# Patient Record
Sex: Female | Born: 1985 | Race: Black or African American | Hispanic: No | Marital: Single | Smoking: Former smoker
Health system: Southern US, Community
[De-identification: ages and names within clinical notes are randomized; demographics above are authoritative.]

## PROBLEM LIST (undated history)

## (undated) DIAGNOSIS — F99 Mental disorder, not otherwise specified: Secondary | ICD-10-CM

## (undated) DIAGNOSIS — B009 Herpesviral infection, unspecified: Secondary | ICD-10-CM

## (undated) HISTORY — DX: Herpesviral infection, unspecified: B00.9

## (undated) HISTORY — DX: Mental disorder, not otherwise specified: F99

---

## 2015-06-11 ENCOUNTER — Emergency Department (HOSPITAL_COMMUNITY)
Admission: EM | Admit: 2015-06-11 | Discharge: 2015-06-11 | Disposition: A | Discharge status: Home / Self Care | Payer: PRIVATE HEALTH INSURANCE | Attending: Emergency Medicine | Admitting: Emergency Medicine

## 2015-06-11 ENCOUNTER — Encounter (HOSPITAL_COMMUNITY): Payer: Self-pay | Admitting: Neurology

## 2015-06-11 DIAGNOSIS — R110 Nausea: Secondary | ICD-10-CM | POA: Insufficient documentation

## 2015-06-11 DIAGNOSIS — M791 Myalgia: Secondary | ICD-10-CM | POA: Diagnosis not present

## 2015-06-11 DIAGNOSIS — Z3202 Encounter for pregnancy test, result negative: Secondary | ICD-10-CM | POA: Insufficient documentation

## 2015-06-11 DIAGNOSIS — R51 Headache: Secondary | ICD-10-CM | POA: Diagnosis not present

## 2015-06-11 DIAGNOSIS — L989 Disorder of the skin and subcutaneous tissue, unspecified: Secondary | ICD-10-CM | POA: Diagnosis not present

## 2015-06-11 DIAGNOSIS — R69 Illness, unspecified: Secondary | ICD-10-CM

## 2015-06-11 DIAGNOSIS — J029 Acute pharyngitis, unspecified: Secondary | ICD-10-CM | POA: Diagnosis present

## 2015-06-11 DIAGNOSIS — J111 Influenza due to unidentified influenza virus with other respiratory manifestations: Secondary | ICD-10-CM | POA: Diagnosis not present

## 2015-06-11 DIAGNOSIS — R5383 Other fatigue: Secondary | ICD-10-CM | POA: Diagnosis not present

## 2015-06-11 DIAGNOSIS — R630 Anorexia: Secondary | ICD-10-CM | POA: Diagnosis not present

## 2015-06-11 LAB — COMPREHENSIVE METABOLIC PANEL
ALK PHOS: 69 U/L (ref 38–126)
ALT: 13 U/L — ABNORMAL LOW (ref 14–54)
AST: 21 U/L (ref 15–41)
Albumin: 3.9 g/dL (ref 3.5–5.0)
Anion gap: 10 (ref 5–15)
BUN: 8 mg/dL (ref 6–20)
CO2: 22 mmol/L (ref 22–32)
Calcium: 9.3 mg/dL (ref 8.9–10.3)
Chloride: 107 mmol/L (ref 101–111)
Creatinine, Ser: 0.84 mg/dL (ref 0.44–1.00)
GLUCOSE: 97 mg/dL (ref 65–99)
Potassium: 3.6 mmol/L (ref 3.5–5.1)
SODIUM: 139 mmol/L (ref 135–145)
Total Bilirubin: 0.5 mg/dL (ref 0.3–1.2)
Total Protein: 7.1 g/dL (ref 6.5–8.1)

## 2015-06-11 LAB — CBC WITH DIFFERENTIAL/PLATELET
Basophils Absolute: 0.1 10*3/uL (ref 0.0–0.1)
Basophils Relative: 1 % (ref 0–1)
EOS PCT: 1 % (ref 0–5)
Eosinophils Absolute: 0.1 10*3/uL (ref 0.0–0.7)
HCT: 42.5 % (ref 36.0–46.0)
HEMOGLOBIN: 14.3 g/dL (ref 12.0–15.0)
LYMPHS ABS: 2.2 10*3/uL (ref 0.7–4.0)
Lymphocytes Relative: 38 % (ref 12–46)
MCH: 28.4 pg (ref 26.0–34.0)
MCHC: 33.6 g/dL (ref 30.0–36.0)
MCV: 84.5 fL (ref 78.0–100.0)
MONO ABS: 0.8 10*3/uL (ref 0.1–1.0)
Monocytes Relative: 13 % — ABNORMAL HIGH (ref 3–12)
NEUTROS ABS: 2.8 10*3/uL (ref 1.7–7.7)
Neutrophils Relative %: 47 % (ref 43–77)
Platelets: 262 10*3/uL (ref 150–400)
RBC: 5.03 MIL/uL (ref 3.87–5.11)
RDW: 13 % (ref 11.5–15.5)
WBC: 5.8 10*3/uL (ref 4.0–10.5)

## 2015-06-11 LAB — URINALYSIS, ROUTINE W REFLEX MICROSCOPIC
BILIRUBIN URINE: NEGATIVE
Glucose, UA: NEGATIVE mg/dL
Hgb urine dipstick: NEGATIVE
KETONES UR: NEGATIVE mg/dL
Leukocytes, UA: NEGATIVE
NITRITE: NEGATIVE
PH: 5 (ref 5.0–8.0)
PROTEIN: NEGATIVE mg/dL
Specific Gravity, Urine: 1.025 (ref 1.005–1.030)
UROBILINOGEN UA: 0.2 mg/dL (ref 0.0–1.0)

## 2015-06-11 LAB — RAPID STREP SCREEN (MED CTR MEBANE ONLY): STREPTOCOCCUS, GROUP A SCREEN (DIRECT): NEGATIVE

## 2015-06-11 LAB — POC URINE PREG, ED: Preg Test, Ur: NEGATIVE

## 2015-06-11 MED ORDER — DOXYCYCLINE HYCLATE 100 MG PO TABS: 100.0000 mg | ORAL_TABLET | Freq: Two times a day (BID) | ORAL | Status: DC

## 2015-06-11 MED ORDER — DOXYCYCLINE HYCLATE 100 MG PO TABS
100.0000 mg | ORAL_TABLET | Freq: Once | ORAL | Status: AC
  Administered 2015-06-11: 100 mg via ORAL
  Filled 2015-06-11: qty 1

## 2015-06-11 MED ORDER — ONDANSETRON HCL 4 MG/2ML IJ SOLN
4.0000 mg | Freq: Once | INTRAMUSCULAR | Status: AC
  Administered 2015-06-11: 4 mg via INTRAVENOUS
  Filled 2015-06-11: qty 2

## 2015-06-11 MED ORDER — KETOROLAC TROMETHAMINE 30 MG/ML IJ SOLN
30.0000 mg | Freq: Once | INTRAMUSCULAR | Status: AC
  Administered 2015-06-11: 30 mg via INTRAVENOUS
  Filled 2015-06-11: qty 1

## 2015-06-11 MED ORDER — SODIUM CHLORIDE 0.9 % IV BOLUS (SEPSIS)
1000.0000 mL | Freq: Once | INTRAVENOUS | Status: AC
  Administered 2015-06-11: 1000 mL via INTRAVENOUS

## 2015-06-11 NOTE — ED Provider Notes (Signed)
CSN: [id number]     Arrival date & time 06/11/15  0727 History   First MD Initiated Contact with Patient 06/11/15 [phone]     Chief Complaint  Patient presents with  . Animal Bite     HPI  Patient presents with multiple complaints. She was generally well until 5 days ago. About that time she developed generalized fatigue with myalgia, headache, sore throat. Symptoms of been persistent, worsening, with associated anorexia, nausea. Headache is diffuse. No fever. No clear alleviating or exacerbating factors, and no relief with OTC medication. No confusion, disorientation. Patient denies visual loss, does describe difficulty focusing visually. Patient recalls possible insect exposure a day prior to the event, with discovery of a very small black, painful lesion on her right medial thigh. Subsequent, the lesion has diminished in size, is not painful, symptomatic. Patient does not smoke, takes no medication beyond Depo-Provera. Last menstrual cycle 2 weeks ago.   History reviewed. No pertinent past medical history. History reviewed. No pertinent past surgical history. No family history on file. History  Substance Use Topics  . Smoking status: Never Smoker   . Smokeless tobacco: Not on file  . Alcohol Use: Yes   OB History    No data available     Review of Systems  Constitutional:       Per HPI, otherwise negative  HENT:       Per HPI, otherwise negative  Respiratory:       Per HPI, otherwise negative  Cardiovascular:       Per HPI, otherwise negative  Gastrointestinal: Negative for vomiting.  Endocrine:       Negative aside from HPI  Genitourinary:       Neg aside from HPI   Musculoskeletal:       Per HPI, otherwise negative  Skin: Negative.   Neurological: Negative for syncope.      Allergies  Review of patient's allergies indicates no known allergies.  Home Medications   Prior to Admission medications   Medication Sig Start Date End Date Taking? Authorizing  Provider  aspirin-acetaminophen-caffeine (EXCEDRIN MIGRAINE) [phone] MG per tablet Take 2 tablets by mouth daily as needed for headache.   Yes Historical Provider, MD  MedroxyPROGESTERone Acetate (DEPO-PROVERA IM) Inject 1 Dose into the muscle every 3 (three) months.   Yes Historical Provider, MD   BP 157/83 mmHg  Pulse 84  Temp(Src) 98.4 F (36.9 C) (Oral)  Resp 16  Ht  (1.651 m)  Wt 170 lb (77.111 kg)  BMI 28.29 kg/m2  SpO2 99%  LMP 05/28/2015 Physical Exam  Constitutional: She is oriented to person, place, and time. She appears well-developed and well-nourished. No distress.  HENT:  Head: Normocephalic and atraumatic.  Eyes: Conjunctivae and EOM are normal.  Cardiovascular: Normal rate and regular rhythm.   Pulmonary/Chest: Effort normal and breath sounds normal. No stridor. No respiratory distress. She has no wheezes.  Abdominal: She exhibits no distension.  Musculoskeletal: She exhibits no edema or tenderness.  Neurological: She is alert and oriented to person, place, and time. No cranial nerve deficit. She exhibits normal muscle tone. Coordination normal.  Skin: Skin is warm and dry.  Right medial thigh with 1 mm black minimally visible nonraised lesion  Psychiatric: She has a normal mood and affect. Her behavior is normal. Thought content normal.  Nursing note and vitals reviewed.   ED Course  Procedures (including critical care time) Labs Review Labs Reviewed  COMPREHENSIVE METABOLIC PANEL - Abnormal; Notable for the following:  ALT 13 (*)    All other components within normal limits  CBC WITH DIFFERENTIAL/PLATELET - Abnormal; Notable for the following:    Monocytes Relative 13 (*)    All other components within normal limits  RAPID STREP SCREEN (NOT AT [facility])  CULTURE, GROUP A STREP  URINALYSIS, ROUTINE W REFLEX MICROSCOPIC (NOT AT [facility])  B. BURGDORFI ANTIBODIES  ROCKY MTN SPOTTED FVR ABS PNL(IGG+IGM)  POC URINE PREG, ED   Pulse oximetry 100% room air  normal  I reviewed the results (including imaging as performed), agree with the interpretation  On repeat exam the patient appears better.  We reviewed all findings.  MDM   Final diagnoses:  Influenza-like illness   Patient presents with influenza-like symptoms for several days, after possible ventral-based exposure. Here patient is awake and alert, but appears fatigued. Patient's initial evaluation is largely reassuring, no evidence for sepsis, bacteremia. Patient has pending studies for Lyme disease, recommend spotted fever, started empiric therapy. Patient was referred to our affiliated primary care center.  Gerhard Munchobert Nakaila Freeze, MD 06/11/15 [phone]

## 2015-06-11 NOTE — Discharge Instructions (Signed)
Today's evaluation has been largely reassuring.  Please take all medication as directed, stay well-hydrated, Plenty of rest.  Recall that there are several laboratory studies that are still being conducted.  You will be made aware of abnormal results.  Otherwise, his be sure to follow-up with our primary care center.

## 2015-06-11 NOTE — ED Notes (Signed)
Pt reports was walking in the woods on Friday, yesterday saw tick on the wall in her room, then noticed itchy pinpoint area to right inner thigh. There was no tick ever noted on skin. Has been feeling nauseated,achy,h/a.

## 2015-06-12 LAB — ROCKY MTN SPOTTED FVR ABS PNL(IGG+IGM)
RMSF IGG: NEGATIVE
RMSF IgM: 0.25 index (ref 0.00–0.89)

## 2015-06-12 LAB — B. BURGDORFI ANTIBODIES

## 2015-06-13 LAB — CULTURE, GROUP A STREP: STREP A CULTURE: NEGATIVE

## 2020-03-27 ENCOUNTER — Encounter (HOSPITAL_BASED_OUTPATIENT_CLINIC_OR_DEPARTMENT_OTHER): Payer: Self-pay | Admitting: *Deleted

## 2020-03-27 ENCOUNTER — Other Ambulatory Visit: Payer: Self-pay

## 2020-03-27 ENCOUNTER — Emergency Department (HOSPITAL_BASED_OUTPATIENT_CLINIC_OR_DEPARTMENT_OTHER)
Admission: EM | Admit: 2020-03-27 | Discharge: 2020-03-27 | Disposition: A | Discharge status: Home / Self Care | Payer: BC Managed Care – PPO | Attending: Emergency Medicine | Admitting: Emergency Medicine

## 2020-03-27 DIAGNOSIS — Y9389 Activity, other specified: Secondary | ICD-10-CM | POA: Insufficient documentation

## 2020-03-27 DIAGNOSIS — S46811A Strain of other muscles, fascia and tendons at shoulder and upper arm level, right arm, initial encounter: Secondary | ICD-10-CM

## 2020-03-27 DIAGNOSIS — S4991XA Unspecified injury of right shoulder and upper arm, initial encounter: Secondary | ICD-10-CM | POA: Diagnosis present

## 2020-03-27 DIAGNOSIS — Y999 Unspecified external cause status: Secondary | ICD-10-CM | POA: Insufficient documentation

## 2020-03-27 DIAGNOSIS — F17210 Nicotine dependence, cigarettes, uncomplicated: Secondary | ICD-10-CM | POA: Diagnosis not present

## 2020-03-27 DIAGNOSIS — Y9289 Other specified places as the place of occurrence of the external cause: Secondary | ICD-10-CM | POA: Insufficient documentation

## 2020-03-27 DIAGNOSIS — X503XXA Overexertion from repetitive movements, initial encounter: Secondary | ICD-10-CM | POA: Insufficient documentation

## 2020-03-27 MED ORDER — METHOCARBAMOL 500 MG PO TABS: 500.0000 mg | ORAL_TABLET | Freq: Two times a day (BID) | ORAL | 0 refills | Status: DC

## 2020-03-27 NOTE — Discharge Instructions (Signed)
Please take Tylenol, ibuprofen, Robaxin for pain.  Please use Tylenol or ibuprofen for pain.  You may use 600 mg ibuprofen every 6 hours or 1000 mg of Tylenol every 6 hours.  You may choose to alternate between the 2.  This would be most effective.  Not to exceed 4 g of Tylenol within 24 hours.  Not to exceed 3200 mg ibuprofen 24 hours.

## 2020-03-27 NOTE — ED Provider Notes (Signed)
[facility] EMERGENCY DEPARTMENT Provider Note   CSN: [id number] Arrival date & time: 03/27/20  1016     History Chief Complaint  Patient presents with  . Neck, shoulder pain    Shelley Love is a 34 y.o. female.  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  She states that she works long hours on cement floors and states that she has had ongoing intermittent neck and shoulder pain for the past several years.  She states over the past month that is flared up again and over the past week it has gotten worse.  She states that she does daily looking down and turning over a boxes.  She states that pain seems to be a on the right side of her neck today and radiate into her right shoulder.  She states it is worse when she looks right and occasionally when she turns her head she feels a "twinge ".  She denies any vision changes, headache, dizziness, loss of consciousness, trauma, chest pain, shortness of breath, weakness or numbness in arms or legs.      History reviewed. No pertinent past medical history.  There are no problems to display for this patient.   History reviewed. No pertinent surgical history.   OB History   No obstetric history on file.     History reviewed. No pertinent family history.  Social History   Tobacco Use  . Smoking status: Current Every Day Smoker    Types: Cigarettes  . Smokeless tobacco: Never Used  . Tobacco comment: occasionally  Substance Use Topics  . Alcohol use: Yes    Comment: occasionally  . Drug use: Yes    Types: Marijuana    Comment: occasionally    Home Medications Prior to Admission medications   Medication Sig Start Date End Date Taking? Authorizing Provider  aspirin-acetaminophen-caffeine (EXCEDRIN MIGRAINE) [phone] MG per tablet Take 2 tablets by mouth daily as needed for headache.   Yes [provider]  methocarbamol (ROBAXIN) 500 MG tablet Take 1 tablet (500 mg total) by mouth 2 (two) times daily.  03/27/20   Tedd Sias, PA    Allergies    Patient has no known allergies.  Review of Systems   Review of Systems  Constitutional: Negative for fever.  HENT: Negative for congestion.   Respiratory: Negative for shortness of breath.   Cardiovascular: Negative for chest pain.  Gastrointestinal: Negative for abdominal distention.  Musculoskeletal:       Pain in Right-sided neck   Neurological: Negative for dizziness and headaches.    Physical Exam Updated Vital Signs BP 126/72 (BP Location: Right Arm)   Pulse 67   Temp 99 F (37.2 C) (Oral)   Resp 16   Ht 5\' 5"  (1.651 m)   Wt 75.8 kg   LMP 03/06/2020   SpO2 100%   BMI 27.79 kg/m   Physical Exam Vitals and nursing note reviewed.  Constitutional:      General: She is not in acute distress.    Appearance: Normal appearance. She is not ill-appearing.  HENT:     Head: Normocephalic and atraumatic.     Mouth/Throat:     Mouth: Mucous membranes are moist.  Eyes:     General: No scleral icterus.       Right eye: No discharge.        Left eye: No discharge.     Conjunctiva/sclera: Conjunctivae normal.  Neck:     Comments: See [facility] Pulmonary:  Effort: Pulmonary effort is normal.     Breath sounds: No stridor.  Musculoskeletal:     Cervical back: Neck supple. Tenderness present. No rigidity.     Comments: Reproducible tenderness to palpation of the right paracervical musculature.  There is also tenderness to palpation of the right trapezius/deltoid.  This is reproducible on exam.  Full range of motion of neck and shoulder.  5/5 strength in bilateral upper and lower extremities.  Neurological:     Mental Status: She is alert and oriented to person, place, and time. Mental status is at baseline.     Comments: Sensation intact bilateral upper and lower extremities.  Gait normal.     ED Results / Procedures / Treatments   Labs (all labs ordered are listed, but only abnormal results are displayed) Labs Reviewed - No  data to display  EKG None  Radiology No results found.  Procedures Procedures (including critical care time)  Medications Ordered in ED Medications - No data to display  ED Course  I have reviewed the triage vital signs and the nursing notes.  Pertinent labs & imaging results that were available during my care of the patient were reviewed by me and considered in my medical decision making (see chart for details).    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  This is been intermittent for some time.  Seems to be worsened at work.  There is no indication that this patient would have emergent medical conditions such as cervical fracture, vertebral artery or carotid artery dissection.  She has no midline tenderness.  No neurologic findings.  Diagnosis trapezius strain, gave strict return precautions.  Recommended conservative measures Robaxin, Tylenol, ibuprofen, stretching, warm salt water soaks and massage.  We discussed the need for behavior modification at work in order to prevent further injury.  Final Clinical Impression(s) / ED Diagnoses Final diagnoses:  Strain of right trapezius muscle, initial encounter    Rx / DC Orders ED Discharge Orders         Ordered    methocarbamol (ROBAXIN) 500 MG tablet  2 times daily,   Status:  Discontinued     03/27/20 1121    methocarbamol (ROBAXIN) 500 MG tablet  2 times daily     03/27/20 1129           Solon Augusta [place], [place] 03/27/20 1132    Geoffery Lyons, MD 03/27/20 1324

## 2020-03-27 NOTE — ED Triage Notes (Signed)
Neck pain stiffness started on the 5th of this month and bilateral shoulder pain

## 2020-03-27 NOTE — ED Notes (Signed)
Pt verbalized understanding of dc instructions.

## 2020-07-18 ENCOUNTER — Other Ambulatory Visit: Payer: Self-pay

## 2020-07-18 ENCOUNTER — Emergency Department (HOSPITAL_COMMUNITY)
Admission: EM | Admit: 2020-07-18 | Discharge: 2020-07-18 | Disposition: A | Discharge status: Home / Self Care | Payer: BC Managed Care – PPO | Attending: Emergency Medicine | Admitting: Emergency Medicine

## 2020-07-18 ENCOUNTER — Encounter (HOSPITAL_COMMUNITY): Payer: Self-pay

## 2020-07-18 DIAGNOSIS — Z20822 Contact with and (suspected) exposure to covid-19: Secondary | ICD-10-CM | POA: Insufficient documentation

## 2020-07-18 DIAGNOSIS — F17210 Nicotine dependence, cigarettes, uncomplicated: Secondary | ICD-10-CM | POA: Insufficient documentation

## 2020-07-18 DIAGNOSIS — R45851 Suicidal ideations: Secondary | ICD-10-CM

## 2020-07-18 DIAGNOSIS — R519 Headache, unspecified: Secondary | ICD-10-CM | POA: Insufficient documentation

## 2020-07-18 DIAGNOSIS — F329 Major depressive disorder, single episode, unspecified: Secondary | ICD-10-CM | POA: Insufficient documentation

## 2020-07-18 DIAGNOSIS — F32A Depression, unspecified: Secondary | ICD-10-CM

## 2020-07-18 DIAGNOSIS — F419 Anxiety disorder, unspecified: Secondary | ICD-10-CM

## 2020-07-18 LAB — COMPREHENSIVE METABOLIC PANEL
ALT: 12 U/L (ref 0–44)
AST: 18 U/L (ref 15–41)
Albumin: 4.5 g/dL (ref 3.5–5.0)
Alkaline Phosphatase: 52 U/L (ref 38–126)
Anion gap: 8 (ref 5–15)
BUN: 9 mg/dL (ref 6–20)
CO2: 22 mmol/L (ref 22–32)
Calcium: 9.3 mg/dL (ref 8.9–10.3)
Chloride: 107 mmol/L (ref 98–111)
Creatinine, Ser: 0.83 mg/dL (ref 0.44–1.00)
GFR calc Af Amer: >60 mL/min (ref >60)
GFR calc non Af Amer: >60 mL/min (ref >60)
Glucose, Bld: 109 mg/dL — ABNORMAL HIGH (ref 70–99)
Potassium: 3.8 mmol/L (ref 3.5–5.1)
Sodium: 137 mmol/L (ref 135–145)
Total Bilirubin: 0.7 mg/dL (ref 0.3–1.2)
Total Protein: 7.4 g/dL (ref 6.5–8.1)

## 2020-07-18 LAB — ETHANOL: Alcohol, Ethyl (B): <10 mg/dL (ref <10)

## 2020-07-18 LAB — ACETAMINOPHEN LEVEL: Acetaminophen (Tylenol), Serum: 13 ug/mL (ref 10–30)

## 2020-07-18 LAB — RAPID URINE DRUG SCREEN, HOSP PERFORMED
Amphetamines: NOT DETECTED
Barbiturates: NOT DETECTED
Benzodiazepines: NOT DETECTED
Cocaine: NOT DETECTED
Opiates: NOT DETECTED
Tetrahydrocannabinol: NOT DETECTED

## 2020-07-18 LAB — CBC
HCT: 42.6 % (ref 36.0–46.0)
Hemoglobin: 13.8 g/dL (ref 12.0–15.0)
MCH: 28.4 pg (ref 26.0–34.0)
MCHC: 32.4 g/dL (ref 30.0–36.0)
MCV: 87.7 fL (ref 80.0–100.0)
Platelets: 304 10*3/uL (ref 150–400)
RBC: 4.86 MIL/uL (ref 3.87–5.11)
RDW: 12.7 % (ref 11.5–15.5)
WBC: 6.5 10*3/uL (ref 4.0–10.5)
nRBC: 0 % (ref 0.0–0.2)

## 2020-07-18 LAB — SALICYLATE LEVEL: Salicylate Lvl: <7 mg/dL — ABNORMAL LOW (ref 7.0–30.0)

## 2020-07-18 LAB — SARS CORONAVIRUS 2 BY RT PCR (HOSPITAL ORDER, PERFORMED IN ~~LOC~~ HOSPITAL LAB): SARS Coronavirus 2: NEGATIVE

## 2020-07-18 LAB — I-STAT BETA HCG BLOOD, ED (MC, WL, AP ONLY): I-stat hCG, quantitative: <5 m[IU]/mL (ref <5)

## 2020-07-18 MED ORDER — ACETAMINOPHEN 500 MG PO TABS
1000.0000 mg | ORAL_TABLET | Freq: Once | ORAL | Status: AC
  Administered 2020-07-18: 1000 mg via ORAL
  Filled 2020-07-18: qty 2

## 2020-07-18 MED ORDER — ONDANSETRON 4 MG PO TBDP
4.0000 mg | ORAL_TABLET | Freq: Once | ORAL | Status: AC
  Administered 2020-07-18: 4 mg via ORAL
  Filled 2020-07-18: qty 1

## 2020-07-18 NOTE — BH Assessment (Signed)
Clinician called the Tele-Assessment machine several times in an attempt to complete pt's BH Assessment but there was no answer. TTS will attempt at a later time.

## 2020-07-18 NOTE — ED Provider Notes (Signed)
[facility]-EMERGENCY DEPT Provider Note   CSN: [id number] Arrival date & time: 07/18/20  2671     History Chief Complaint  Patient presents with  . Suicidal    Shelley Love is a 34 y.o. female.  Patient to ED with progressive depression with suicidal ideation today. She denies self harm. She reports headache "probably because I've been crying all day." No ss/sxs illness. She reports remote history of suicidal thoughts at the age of 62.   The history is provided by the patient. No language interpreter was used.       History reviewed. No pertinent past medical history.  There are no problems to display for this patient.   History reviewed. No pertinent surgical history.   OB History   No obstetric history on file.     No family history on file.  Social History   Tobacco Use  . Smoking status: Current Every Day Smoker    Types: Cigarettes  . Smokeless tobacco: Never Used  . Tobacco comment: occasionally  Vaping Use  . Vaping Use: Former  Substance Use Topics  . Alcohol use: Yes    Comment: occasionally  . Drug use: Yes    Types: Marijuana    Comment: occasionally    Home Medications Prior to Admission medications   Medication Sig Start Date End Date Taking? Authorizing Provider  aspirin-acetaminophen-caffeine (EXCEDRIN MIGRAINE) [phone] MG per tablet Take 2 tablets by mouth daily as needed for headache.    [provider]  methocarbamol (ROBAXIN) 500 MG tablet Take 1 tablet (500 mg total) by mouth 2 (two) times daily. 03/27/20   Gailen Shelter, PA    Allergies    Patient has no known allergies.  Review of Systems   Review of Systems  Constitutional: Negative for chills and fever.  HENT: Negative.   Respiratory: Negative.   Cardiovascular: Negative.   Gastrointestinal: Negative.   Musculoskeletal: Negative.   Skin: Negative.   Neurological: Positive for headaches.  Psychiatric/Behavioral: Positive for dysphoric  mood and suicidal ideas.    Physical Exam Updated Vital Signs BP 122/86 (BP Location: Left Arm)   Pulse 75   Temp 98.5 F (36.9 C) (Oral)   Resp 19   SpO2 99%   Physical Exam Vitals and nursing note reviewed.  Constitutional:      Appearance: She is well-developed.  HENT:     Head: Normocephalic.  Cardiovascular:     Rate and Rhythm: Normal rate and regular rhythm.  Pulmonary:     Effort: Pulmonary effort is normal.     Breath sounds: Normal breath sounds.  Abdominal:     General: Bowel sounds are normal.     Palpations: Abdomen is soft.     Tenderness: There is no abdominal tenderness. There is no guarding or rebound.  Musculoskeletal:        General: Normal range of motion.     Cervical back: Normal range of motion and neck supple.  Skin:    General: Skin is warm and dry.     Findings: No rash.  Neurological:     Mental Status: She is alert.     Cranial Nerves: No cranial nerve deficit.     ED Results / Procedures / Treatments   Labs (all labs ordered are listed, but only abnormal results are displayed) Labs Reviewed  CBC  COMPREHENSIVE METABOLIC PANEL  ETHANOL  SALICYLATE LEVEL  ACETAMINOPHEN LEVEL  RAPID URINE DRUG SCREEN, HOSP PERFORMED  I-STAT BETA HCG BLOOD,  ED ([facility], [facility], [facility] ONLY)    EKG None  Radiology No results found.  Procedures Procedures (including critical care time)  Medications Ordered in ED Medications - No data to display  ED Course  I have reviewed the triage vital signs and the nursing notes.  Pertinent labs & imaging results that were available during my care of the patient were reviewed by me and considered in my medical decision making (see chart for details).    MDM Rules/Calculators/A&P                          Patient voluntarily to ED with severe depression and suicidal ideations.   She is considered medically cleared for TTS evaluation which is pending.   Final Clinical Impression(s) / ED Diagnoses Final  diagnoses:  None   1. Depression 2. Suicidal ideations  Rx / DC Orders ED Discharge Orders    None       Danne Harbor 07/18/20 0710    Glynn Octave, MD 07/18/20 [phone]

## 2020-07-18 NOTE — ED Triage Notes (Signed)
Patient arrived stating that she has been feeling suicidal due to work and family stressors. States she would cut her wrists. Declines any harm to herself today.

## 2020-07-18 NOTE — Consult Note (Signed)
[facility] Psych ED Progress Note  07/18/2020 10:03 AM Shelley Love  MRN:  [id number] Subjective: Patient states "I had an episode yesterday where a lot of things were happening leading up to suicidal thoughts."  Patient reports readiness to discharge today, reports need to attend a job interview later today.  Patient denies suicidal ideations currently.  Patient states "I had some time to myself while I was here, some time to think, I do not want to hurt myself."  Patient endorses 1 prior suicide attempt in 2009.  Patient reports she was diagnosed with anxiety and depression in 2009, prescribed Zoloft at that time believes it helped very little."  Patient denies any current medications. 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  Patient reports current stressors include financial concerns, "family stuff" and my boyfriend.  Patient reports she resides in [place] with her boyfriend.  Patient denies access to weapons.  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  Patient endorses alcohol use approximately 2 drinks 2 times per week.  Patient endorses marijuana use approximately 1 joint 2 times per week.  Patient denies substance use aside from marijuana.  Patient does not believe she needs substance use treatment at this time.  Patient reports she does not currently have insurance but would like to follow-up with outpatient psychiatry and talk therapy.  Discussed follow-up with [facility], patient agrees.  Patient assessed by nurse practitioner.  Patient alert and oriented x4, pleasant and cooperative during assessment.  Patient appears anxious, affect is congruent with mood.  Patient tearful at times when describing current stressors.  Patient denies homicidal ideations.  Patient denies auditory and visual hallucinations.  There is no evidence of delusional  thought content and patient does not appear to be responding to internal stimuli.  Patient denies symptoms of paranoia.  Patient gives verbal consent to speak with her boyfriend of the home, Eugenia Pancoast, phone number [phone]. Spoke with patient's boyfriend, Marlowe Sax who denies concerns for safety.  Patient boyfriend denies any history of self-harm or suicidal ideations to his knowledge.  Patient's boyfriend reports patient can return home.  Principal Problem: Anxiety Diagnosis:  Principal Problem:   Anxiety  Total Time spent with patient: 30 minutes  Past Psychiatric History: Anxiety, depression per patient report  Past Medical History: History reviewed. No pertinent past medical history. History reviewed. No pertinent surgical history. Family History: No family history on file. Family Psychiatric  History: None reported Social History:  Social History   Substance and Sexual Activity  Alcohol Use Yes   Comment: occasionally     Social History   Substance and Sexual Activity  Drug Use Yes  . Types: Marijuana   Comment: occasionally    Social History   Socioeconomic History  . Marital status: Single    Spouse name: Not on file  . Number of children: Not on file  . Years of education: Not on file  . Highest education level: Not on file  Occupational History  . Not on file  Tobacco Use  . Smoking status: Current Every Day Smoker    Types: Cigarettes  . Smokeless tobacco: Never Used  . Tobacco comment: occasionally  Vaping Use  . Vaping Use: Former  Substance and Sexual Activity  . Alcohol use: Yes    Comment: occasionally  . Drug use: Yes    Types: Marijuana    Comment: occasionally  .  Sexual activity: Yes  Other Topics Concern  . Not on file  Social History Narrative  . Not on file   Social Determinants of Health   Financial Resource Strain:   . Difficulty of Paying Living Expenses:   Food Insecurity:   . Worried About Programme researcher, broadcasting/film/video in the Last  Year:   . Barista in the Last Year:   Transportation Needs:   . Freight forwarder (Medical):   Marland Kitchen Lack of Transportation (Non-Medical):   Physical Activity:   . Days of Exercise per Week:   . Minutes of Exercise per Session:   Stress:   . Feeling of Stress :   Social Connections:   . Frequency of Communication with Friends and Family:   . Frequency of Social Gatherings with Friends and Family:   . Attends Religious Services:   . Active Member of Clubs or Organizations:   . Attends Banker Meetings:   Marland Kitchen Marital Status:     Sleep: Fair  Appetite:  Fair  Current Medications: No current facility-administered medications for this encounter.   Current Outpatient Medications  Medication Sig Dispense Refill  . aspirin-acetaminophen-caffeine (EXCEDRIN MIGRAINE) 250-250-65 MG per tablet Take 2 tablets by mouth daily as needed for headache.    . ibuprofen (ADVIL) 200 MG tablet Take 400 mg by mouth every 6 (six) hours as needed for moderate pain.    . methocarbamol (ROBAXIN) 500 MG tablet Take 1 tablet (500 mg total) by mouth 2 (two) times daily. (Patient not taking: Reported on 07/18/2020) 20 tablet 0    Lab Results:  Results for orders placed or performed during the hospital encounter of 07/18/20 (from the past 48 hour(s))  Comprehensive metabolic panel     Status: Abnormal   Collection Time: 07/18/20  2:33 AM  Result Value Ref Range   Sodium 137 135 - 145 mmol/L   Potassium 3.8 3.5 - 5.1 mmol/L   Chloride 107 98 - 111 mmol/L   CO2 22 22 - 32 mmol/L   Glucose, Bld 109 (H) 70 - 99 mg/dL    Comment: Glucose reference range applies only to samples taken after fasting for at least 8 hours.   BUN 9 6 - 20 mg/dL   Creatinine, Ser 5.36 0.44 - 1.00 mg/dL   Calcium 9.3 8.9 - 64.4 mg/dL   Total Protein 7.4 6.5 - 8.1 g/dL   Albumin 4.5 3.5 - 5.0 g/dL   AST 18 15 - 41 U/L   ALT 12 0 - 44 U/L   Alkaline Phosphatase 52 38 - 126 U/L   Total Bilirubin 0.7 0.3 - 1.2  mg/dL   GFR calc non Af Amer >60 >60 mL/min   GFR calc Af Amer >60 >60 mL/min   Anion gap 8 5 - 15    Comment: Performed at [facility], [address], [place], [place] [address]  Ethanol     Status: None   Collection Time: 07/18/20  2:33 AM  Result Value Ref Range   Alcohol, Ethyl (B) <10 <10 mg/dL    Comment: (NOTE) Lowest detectable limit for serum alcohol is 10 mg/dL.  For medical purposes only. Performed at [facility], [address] [place], [place] [address]   Salicylate level     Status: Abnormal   Collection Time: 07/18/20  2:33 AM  Result Value Ref Range   Salicylate Lvl <7.0 (L) 7.0 - 30.0 mg/dL    Comment: Performed at  [facility], [address] [place], [place] [address]  Acetaminophen level     Status: None   Collection Time: 07/18/20  2:33 AM  Result Value Ref Range   Acetaminophen (Tylenol), Serum 13 10 - 30 ug/mL    Comment: (NOTE) Therapeutic concentrations vary significantly. A range of 10-30 ug/mL  may be an effective concentration for many patients. However, some  are best treated at concentrations outside of this range. Acetaminophen concentrations >150 ug/mL at 4 hours after ingestion  and >50 ug/mL at 12 hours after ingestion are often associated with  toxic reactions.  Performed at [facility], [address] [place], [place] [address]   cbc     Status: None   Collection Time: 07/18/20  2:33 AM  Result Value Ref Range   WBC 6.5 4.0 - 10.5 K/uL   RBC 4.86 3.87 - 5.11 MIL/uL   Hemoglobin 13.8 12.0 - 15.0 g/dL   HCT 09.8 36 - 46 %   MCV 87.7 80.0 - 100.0 fL   MCH 28.4 26.0 - 34.0 pg   MCHC 32.4 30.0 - 36.0 g/dL   RDW 11.9 14.7 - 82.9 %   Platelets 304 150 - 400 K/uL   nRBC 0.0 0.0 - 0.2 %    Comment: Performed at [facility], [address] [place], [place] [address]  I-Stat beta hCG blood, ED     Status: None   Collection Time: 07/18/20   2:45 AM  Result Value Ref Range   I-stat hCG, quantitative <5.0 <5 mIU/mL   Comment 3            Comment:   GEST. AGE      CONC.  (mIU/mL)   <=1 WEEK        5 - 50     2 WEEKS       50 - 500     3 WEEKS       100 - 10,000     4 WEEKS     1,000 - 30,000        FEMALE AND NON-PREGNANT FEMALE:     LESS THAN 5 mIU/mL   SARS Coronavirus 2 by RT PCR (hospital order, performed in [facility] hospital lab) Nasopharyngeal Nasopharyngeal Swab     Status: None   Collection Time: 07/18/20  3:28 AM   Specimen: Nasopharyngeal Swab  Result Value Ref Range   SARS Coronavirus 2 NEGATIVE NEGATIVE    Comment: (NOTE) SARS-CoV-2 target nucleic acids are NOT DETECTED.  The SARS-CoV-2 RNA is generally detectable in upper and lower respiratory specimens during the acute phase of infection. The lowest concentration of SARS-CoV-2 viral copies this assay can detect is 250 copies / mL. A negative result does not preclude SARS-CoV-2 infection and should not be used as the sole basis for treatment or other patient management decisions.  A negative result may occur with improper specimen collection / handling, submission of specimen other than nasopharyngeal swab, presence of viral mutation(s) within the areas targeted by this assay, and inadequate number of viral copies (<250 copies / mL). A negative result must be combined with clinical observations, patient history, and epidemiological information.  Fact Sheet for Patients:   BoilerBrush.com.cy  Fact Sheet for Healthcare Providers: https://pope.com/  This test is not yet approved or  cleared by the [place] FDA and has been authorized for detection and/or diagnosis of SARS-CoV-2 by FDA under an Emergency Use Authorization (EUA).  This EUA  will remain in effect (meaning this test can be used) for the duration of the COVID-19 declaration under Section 564(b)(1) of the Act, 21 U.S.C. section  360bbb-3(b)(1), unless the authorization is terminated or revoked sooner.  Performed at [facility], [address] [place], [place] [address]   Rapid urine drug screen (hospital performed)     Status: None   Collection Time: 07/18/20  3:30 AM  Result Value Ref Range   Opiates NONE DETECTED NONE DETECTED   Cocaine NONE DETECTED NONE DETECTED   Benzodiazepines NONE DETECTED NONE DETECTED   Amphetamines NONE DETECTED NONE DETECTED   Tetrahydrocannabinol NONE DETECTED NONE DETECTED   Barbiturates NONE DETECTED NONE DETECTED    Comment: (NOTE) DRUG SCREEN FOR MEDICAL PURPOSES ONLY.  IF CONFIRMATION IS NEEDED FOR ANY PURPOSE, NOTIFY LAB WITHIN 5 DAYS.  LOWEST DETECTABLE LIMITS FOR URINE DRUG SCREEN Drug Class                     Cutoff (ng/mL) Amphetamine and metabolites    1000 Barbiturate and metabolites    200 Benzodiazepine                 200 Tricyclics and metabolites     300 Opiates and metabolites        300 Cocaine and metabolites        300 THC                            50 Performed at [facility], [address] [place], [place] [address]     Blood Alcohol level:  Lab Results  Component Value Date   ETH <10 07/18/2020    Physical Findings: AIMS:  , ,  ,  ,    CIWA:    COWS:     Musculoskeletal: Strength & Muscle Tone: within normal limits Gait & Station: normal Patient leans: N/A  Psychiatric Specialty Exam: Physical Exam Vitals and nursing note reviewed.  Constitutional:      General: She is not in acute distress.    Appearance: She is well-developed.  HENT:     Head: Normocephalic and atraumatic.  Eyes:     Conjunctiva/sclera: Conjunctivae normal.  Cardiovascular:     Rate and Rhythm: Normal rate and regular rhythm.     Heart sounds: No murmur heard.   Pulmonary:     Effort: Pulmonary effort is normal. No respiratory distress.     Breath sounds: Normal breath sounds.  Abdominal:     Palpations:  Abdomen is soft.     Tenderness: There is no abdominal tenderness.  Musculoskeletal:     Cervical back: Neck supple.  Skin:    General: Skin is warm and dry.  Neurological:     Mental Status: She is alert.  Psychiatric:        Attention and Perception: Attention and perception normal.        Mood and Affect: Mood is anxious. Affect is tearful.        Speech: Speech normal.        Behavior: Behavior normal. Behavior is cooperative.        Thought Content: Thought content normal.        Cognition and Memory: Cognition and memory normal.        Judgment: Judgment normal.     Review of Systems  Constitutional: Negative.   HENT: Negative.   Eyes: Negative.  Respiratory: Negative.   Cardiovascular: Negative.   Gastrointestinal: Negative.   Genitourinary: Negative.   Musculoskeletal: Negative.   Skin: Negative.   Neurological: Negative.   Psychiatric/Behavioral: The patient is nervous/anxious.     Blood pressure 122/86, pulse 75, temperature 98.5 F (36.9 C), temperature source Oral, resp. rate 19, SpO2 99 %.There is no height or weight on file to calculate BMI.  General Appearance: Casual and Fairly Groomed  Eye Contact:  Good  Speech:  Clear and Coherent and Normal Rate  Volume:  Normal  Mood:  Anxious  Affect:  Congruent  Thought Process:  Coherent, Goal Directed and Descriptions of Associations: Intact  Orientation:  Full (Time, Place, and Person)  Thought Content:  WDL and Logical  Suicidal Thoughts:  No  Homicidal Thoughts:  No  Memory:  Immediate;   Good Recent;   Good Remote;   Good  Judgement:  Fair  Insight:  Fair  Psychomotor Activity:  Normal  Concentration:  Concentration: Good and Attention Span: Good  Recall:  Good  Fund of Knowledge:  Good  Language:  Good  Akathisia:  No  Handed:  Right  AIMS (if indicated):     Assets:  Communication Skills Desire for Improvement Financial Resources/Insurance Housing Intimacy Leisure Time Physical  Health Resilience Social Support Talents/Skills  ADL's:  Intact  Cognition:  WNL  Sleep:         Treatment Plan Summary: Patient reviewed with Dr Nelly Rout.   Patient cleared by psychiatry with plan to follow-up at [facility] Appointment scheduled with [facility] September 11, 2020 at 0900 with psychiatry and September 09, 2020 at 1400.   Patrcia Dolly, FNP 07/18/2020, 10:03 AM

## 2020-07-18 NOTE — Discharge Instructions (Signed)
For your behavioral health needs, you are advised to follow up with [facility].  You are scheduled for the following appointments:  THERAPY:      Tuesday, September 09, 2020 at 2:00 pm with Marybelle Killings, LCSW  PSYCHIATRY:      Thursday, September 11, 2020 at 9:00 am with Zena Amos, MD       [facility]      [address]      [place], [place] [address]      [phone]

## 2020-07-18 NOTE — BH Assessment (Addendum)
[facility] Assessment Progress Note  Per Berneice Heinrich, NP, this pt does not require psychiatric hospitalization at this time.  Pt is to be discharged from [facility] with outpatient appointments at [facility].  Pt is scheduled with Marybelle Killings, LCSW on Tuesday, 09/09/2020 at 14:00, and with Zena Amos, MD on Thursday, 09/11/2020 at 09:00.  These have been included in pt's discharge instructions.  Pt's nurse, [place], has been notified.  Doylene Canning, MA Triage Specialist [phone]

## 2020-09-09 ENCOUNTER — Ambulatory Visit (INDEPENDENT_AMBULATORY_CARE_PROVIDER_SITE_OTHER): Payer: No Payment, Other | Admitting: Licensed Clinical Social Worker

## 2020-09-09 ENCOUNTER — Other Ambulatory Visit: Payer: Self-pay

## 2020-09-09 DIAGNOSIS — F418 Other specified anxiety disorders: Secondary | ICD-10-CM | POA: Diagnosis not present
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
## 2020-09-11 ENCOUNTER — Encounter (HOSPITAL_COMMUNITY): Payer: Self-pay | Admitting: Psychiatry

## 2020-09-11 ENCOUNTER — Ambulatory Visit (INDEPENDENT_AMBULATORY_CARE_PROVIDER_SITE_OTHER): Payer: No Payment, Other | Admitting: Psychiatry

## 2020-09-11 ENCOUNTER — Other Ambulatory Visit (HOSPITAL_COMMUNITY): Payer: Self-pay | Admitting: Psychiatry

## 2020-09-11 ENCOUNTER — Other Ambulatory Visit: Payer: Self-pay

## 2020-09-11 VITALS — BP 124/84 | HR 81 | Ht 64.5 in | Wt 160.0 lb

## 2020-09-11 DIAGNOSIS — F1210 Cannabis abuse, uncomplicated: Secondary | ICD-10-CM | POA: Insufficient documentation

## 2020-09-11 DIAGNOSIS — F339 Major depressive disorder, recurrent, unspecified: Secondary | ICD-10-CM | POA: Insufficient documentation

## 2020-09-11 MED ORDER — SERTRALINE HCL 50 MG PO TABS: 50.0000 mg | ORAL_TABLET | Freq: Every day | ORAL | 1 refills | Status: DC

## 2020-09-11 MED ORDER — HYDROXYZINE HCL 10 MG PO TABS: 10.0000 mg | ORAL_TABLET | Freq: Two times a day (BID) | ORAL | 1 refills | Status: DC | PRN

## 2020-09-11 MED FILL — hydrOXYzine HCL 10 MG TABS: 10 | 30 days supply | Qty: 60 | Fill #0

## 2020-09-11 MED FILL — SERTRALINE HCL 50 MG TABLET: 50 | 30 days supply | Qty: 30 | Fill #0
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
## 2020-10-01 ENCOUNTER — Ambulatory Visit (HOSPITAL_COMMUNITY): Payer: No Payment, Other | Admitting: Licensed Clinical Social Worker

## 2020-10-23 ENCOUNTER — Ambulatory Visit (INDEPENDENT_AMBULATORY_CARE_PROVIDER_SITE_OTHER): Payer: No Payment, Other | Admitting: Licensed Clinical Social Worker

## 2020-10-23 ENCOUNTER — Other Ambulatory Visit: Payer: Self-pay

## 2020-10-23 ENCOUNTER — Ambulatory Visit (HOSPITAL_COMMUNITY): Payer: No Payment, Other | Admitting: Psychiatry

## 2020-10-23 DIAGNOSIS — F418 Other specified anxiety disorders: Secondary | ICD-10-CM | POA: Diagnosis not present

## 2020-10-24 ENCOUNTER — Ambulatory Visit (HOSPITAL_COMMUNITY): Payer: No Payment, Other | Admitting: Psychiatry

## 2020-10-27 NOTE — Progress Notes (Signed)
   THERAPIST PROGRESS NOTE  Session Time: 55 min  Participation Level: Active  Behavioral Response: CasualAlertAnxious and Depressed  Type of Therapy: Individual Therapy  Treatment Goals addressed: Communication: Depr/anx/coping  Interventions: CBT, Motivational Interviewing and Supportive  Summary: Shelley Love is a 34 y.o. female who presents with hx of dep/anx. Pt comes for in person session per her preference. This is first session since initial session on 09/09/20. Gap in care addressed and pt acknowledges this was difficult. LCSW provides education on new walk in hours to attempt to give more options until additional staff hired. Pt verbalizes understanding. LCSW assessed for pt's overall status and coping. Pt advises she is continuing to have high levels of stress, dep, anx. She states her financial situation is extremely anx provoking. Pt advises she did just start a new job with [employer] working 4am-8am. 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 She does feel it is a good fit for her and will be working hard to see if she can get more hours in addition to becoming permanent. Pt reports mother is "looking" for work. Pt states her rent is 2 mon behind, her phone is currently cut off. She has applied for financial assist and hopes it will be fianalized soon. Pt has also applied for food assistance. LCSW provided written listing of food pantries. Reviewed importance of adequate nutrition and rest for coping. Pt states she does have meds and is taking them as prescribed. She states her boyfriend will help her with some financial matters. Remainder of session spent addressing coping and coping strategies. LCSW introduced self talk and the importance of self talk on thoughts/behaviors/self esteem. Pt provided with literature for her further education. Reviewed using a calendar for scheduling to help keep organized. Discussed goals. Pt mentions thinking she would like to go to  school to become a Curator. LCSW recommended checking into [employer] with financial aid and not delaying small obtainable steps toward goals/desires. LCSW reviewed poc and next appts prior to close of session. Pt states appreciation for care.      Suicidal/Homicidal: Nowithout intent/plan  Therapist Response: Pt remains receptive to care.  Plan: Return again in 2 weeks.  Diagnosis: Axis I: Depression with anxiety    Axis II: Deferred  [place] Sink, LCSW 10/27/2020

## 2020-11-26 ENCOUNTER — Other Ambulatory Visit: Payer: Self-pay

## 2020-11-26 ENCOUNTER — Ambulatory Visit (INDEPENDENT_AMBULATORY_CARE_PROVIDER_SITE_OTHER): Payer: No Payment, Other | Admitting: Licensed Clinical Social Worker

## 2020-11-26 DIAGNOSIS — F418 Other specified anxiety disorders: Secondary | ICD-10-CM

## 2020-11-26 NOTE — Progress Notes (Signed)
THERAPIST PROGRESS NOTE   Virtual Visit via Video Note  I connected with Shelley Love on 11/26/20 at  1:00 PM EST by a video enabled telemedicine application and verified that I am speaking with the correct person using two identifiers.  Location: Patient: Home Provider: [facility]   I discussed the limitations of evaluation and management by telemedicine and the availability of in person appointments. The patient expressed understanding and agreed to proceed. I discussed the assessment and treatment plan with the patient. The patient was provided an opportunity to ask questions and all were answered. The patient agreed with the plan and demonstrated an understanding of the instructions.  I provided 55 min minutes of non-face-to-face time during this encounter.  Participation Level: Active  Behavioral Response: CasualAlertAnxious and mildly depressed  Type of Therapy: Individual Therapy  Treatment Goals addressed: Communication: Anxiety/depression/coping  Interventions: Motivational Interviewing, Solution Focused, Supportive and Other: Reality therapy  Summary: Shelley Love is a 34 y.o. female who presents with hx of dep/anx. This date pt signs on for first video session per her preference. Pt reports she has been ill with a bad cold and sinus infection. She continues to be congested and recovering. Pt advises a lot has happened since last session. Pt states she is still working at [employer] and it is going well. 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 She has not picked up additional hours as yet but hopes to. LCSW assessed for financial strain and delinquent bill payment. Pt reports she had to go to court last wk for eviction. She states the case was continued for 2 mon. She has finally gotten notice she is approved for 6 mon of rental assistance through the city and should have rent covered  including back rent until March 2022. Pt has also gotten approved for 2 mon of utility payment. She states she is "very relieved". She is still struggling with car payments and car insurance. Today pt reports she has 2 vehicles, one fully paid for but needs some work and a new/used vehicle she bought in June of this year right before she lost her job. Pt states her mother is still working and just got her first pay check. Pt states "She gave me a little bit of it". LCSW further assessed the financial situation with mother/pt. Pt advises she has been paying for a storage unit with mother's belongings in it for 5 yrs in addition to all household expenses except food. She states mother does help to purchase food and shares. LCSW explores pt's thoughts about the role reversal she appears to be in, which is heavily contributing to her dep/anx. Pt states "I"m done". She is angry and resentful she has had to use all of her savings, etc since unemployed and "will have to start over". She admits she feels used and taken advantage of by her fam, particularly mother. LCSW assessed for pt's past attempts to address this problem. Had reality based, solution focused counseling on options to address situation concretely, which pt will consider. Pt states "It scares me" when thinking of addressing this with mother. Pt states mother either gets "upset" or "is agreeable but nothing ever changes". Reviewed importance of consequences pt can follow through with. Pt will consider writing out her thoughts to discuss next session. LCSW assessed for pt's nutrition/rest. Pt continues to be poorly managed in this area saying she got  3 hrs of sleep last night. Reviewed impacts including coping. Pt encouraged to make this a priority. She has gotten a calendar as suggested last session to keep up with all of her responsibilities. Pt states she finds this very helpful and she feels less overwhelmed. LCSW provided education on [employer] Journalist, newspaper  program. Pt states she has heard [employer] also has a program. LCSW encouraged pt to eval options and move toward her desired goal. Assessed for coping with the holidays. Reviewed poc including next sesssion prior to close of session. Pt states appreciation for care.    Suicidal/Homicidal: Nowithout intent/plan  Therapist Response: Pt remains receptive to care.  Plan: Return again in ~2 weeks.  Diagnosis: Axis I: Depression with anxiety    Axis II: Deferred  [place] Sink, LCSW 11/26/2020

## 2020-12-17 ENCOUNTER — Other Ambulatory Visit: Payer: Self-pay

## 2020-12-17 ENCOUNTER — Encounter (HOSPITAL_COMMUNITY): Payer: Self-pay

## 2020-12-17 ENCOUNTER — Ambulatory Visit (INDEPENDENT_AMBULATORY_CARE_PROVIDER_SITE_OTHER): Payer: No Payment, Other | Admitting: Licensed Clinical Social Worker

## 2020-12-17 ENCOUNTER — Emergency Department (HOSPITAL_COMMUNITY)
Admission: EM | Admit: 2020-12-17 | Discharge: 2020-12-17 | Disposition: A | Discharge status: Home / Self Care | Payer: Self-pay | Attending: Emergency Medicine | Admitting: Emergency Medicine

## 2020-12-17 DIAGNOSIS — Z7982 Long term (current) use of aspirin: Secondary | ICD-10-CM | POA: Insufficient documentation

## 2020-12-17 DIAGNOSIS — F418 Other specified anxiety disorders: Secondary | ICD-10-CM | POA: Diagnosis not present

## 2020-12-17 DIAGNOSIS — Z79899 Other long term (current) drug therapy: Secondary | ICD-10-CM | POA: Insufficient documentation

## 2020-12-17 DIAGNOSIS — Z23 Encounter for immunization: Secondary | ICD-10-CM | POA: Insufficient documentation

## 2020-12-17 DIAGNOSIS — S91312A Laceration without foreign body, left foot, initial encounter: Secondary | ICD-10-CM | POA: Insufficient documentation

## 2020-12-17 DIAGNOSIS — F17210 Nicotine dependence, cigarettes, uncomplicated: Secondary | ICD-10-CM | POA: Insufficient documentation

## 2020-12-17 DIAGNOSIS — W231XXA Caught, crushed, jammed, or pinched between stationary objects, initial encounter: Secondary | ICD-10-CM | POA: Insufficient documentation

## 2020-12-17 MED ORDER — TETANUS-DIPHTH-ACELL PERTUSSIS 5-2.5-18.5 LF-MCG/0.5 IM SUSY
0.5000 mL | PREFILLED_SYRINGE | Freq: Once | INTRAMUSCULAR | Status: AC
  Administered 2020-12-17: 0.5 mL via INTRAMUSCULAR
  Filled 2020-12-17: qty 0.5

## 2020-12-17 NOTE — Discharge Instructions (Signed)
Continue wound care as you have been doing.  If there is any significant increase in swelling, redness, pus drainage, return for wound recheck.

## 2020-12-17 NOTE — ED Triage Notes (Signed)
Pt arrived via walk in, states about a week ago she got her ankle caught on metal door, states small lac to area, +CSM, no issues with mvmt, she is concerned is infected. Denies any fevers or colored drainage from area.

## 2020-12-18 NOTE — Progress Notes (Signed)
THERAPIST PROGRESS NOTE  Virtual Visit via Video Note  I connected with Shelley Love on 12/17/20 at  2:00 PM EST by a video enabled telemedicine application and verified that I am speaking with the correct person using two identifiers.  Location: Patient: Home Provider: [facility]   I discussed the limitations of evaluation and management by telemedicine and the availability of in person appointments. The patient expressed understanding and agreed to proceed. I discussed the assessment and treatment plan with the patient. The patient was provided an opportunity to ask questions and all were answered. The patient agreed with the plan and demonstrated an understanding of the instructions.   I provided 55 minutes of non-face-to-face time during this encounter.  Participation Level: Active  Behavioral Response: CasualAlertAnxious and Depressed  Type of Therapy: Individual Therapy  Treatment Goals addressed: Communication: Anx/Dep/Coping  Interventions: Motivational Interviewing, Solution Focused and Supportive  Summary: Shelley Love is a 35 y.o. female who presents with hx of anx/dep. This date pt signs on for video session per her preference. LCSW assessed for pt's overall status since last session. Pt reports no significant changes but "I did have a scare". Pt advises she had an accident with the door to her home closing on her LE/foot causing a wound in which she reports "I could see the tendon". Pt did not seek medical care right away and admits this was not a good decision. She reports she did go for care this morning as she started to be fearful she could have infection/become septic. Was treated and released at [facility]. Injury occurred Jan 2nd. Pt states she did call several urgent care locations in her area when injury first occurred and each was "permanently closed" so pt stopped trying. Pt encouraged to maintain pursuit when an injury is this bad. LCSW assessed for status of work. Pt states  she is now a permanent employee but has not been given additional hours. Pt states she is thinking of getting a second job to increase income and manage finances. Pt states she is still behind on car payment/ins. She has not looked into the Journalist, newspaper program. Pt states she has been spending a lot of time dealing with unemployment filing since she was given wrong information at one point. LCSW assessed for mood. Pt reports she is managing okay. She states she does note more mood swings with menstrual cycle. Reports some irritability at work. LCSW assessed for meds. Pt states she is not talking meds and has been off about a month. She states she felt she was more irritable with medication and did not like the way it made her feel. Discussed option of med management appt to try a different regimen. Pt agreeable. LCSW facilitated appt post session. LCSW assessed for pt's thoughts on relationship with mother. Pt advises she did talk with her mother about not paying the storage unit which pt will f/u on again to assure mother is aware when she has to have belongings out or forfeit. Pt also states mother was to give her $200 from last paycheck but gave her less and she addressed this "calmly" with mother. Reports mother got mad but did give her the rest of the money. Assessed for pt's awareness of self talk. Pt states she is staying mindful. She replies "It's hard to break the habit of talking bad about myself". LCSW acknowledged and provided additional education/encouragement. Pt continuing to use calendar to help her organize and feel less overwhelmed, cope better. LCSW reviewed poc including  scheduling prior to close of session. Pt states appreciation for care.    Suicidal/Homicidal: Nowithout intent/plan  Therapist Response: Pt remains receptive to care.  Plan: Return again in ~3 weeks.  Diagnosis: Axis I: Anxiety with depression    Axis II: Deferred  [place] Sink, LCSW 12/18/2020

## 2020-12-19 NOTE — ED Provider Notes (Signed)
[facility]-EMERGENCY DEPT Provider Note   CSN: [id number] Arrival date & time: 12/17/20  0759     History Chief Complaint  Patient presents with  . Extremity Laceration    Shalyn Tait is a 35 y.o. female.  Comes to ER for wound check.  She states about a week ago the inside of her left ankle suffered a small laceration on the door.  She did not seek medical care at the time.  Wanted to make sure it was not getting infected.  Noted slight amount of redness.  No drainage.  No fevers.  Denies any medical problems.  Unsure of last tetanus.  HPI     History reviewed. No pertinent past medical history.  Patient Active Problem List   Diagnosis Date Noted  . Major depressive disorder, recurrent episode with anxious distress (HCC) 09/11/2020  . Cannabis use disorder, mild, abuse 09/11/2020  . Anxiety 07/18/2020    History reviewed. No pertinent surgical history.   OB History   No obstetric history on file.     History reviewed. No pertinent family history.  Social History   Tobacco Use  . Smoking status: Current Every Day Smoker    Types: Cigarettes  . Smokeless tobacco: Never Used  . Tobacco comment: occasionally  Vaping Use  . Vaping Use: Former  Substance Use Topics  . Alcohol use: Yes    Comment: occasionally  . Drug use: Yes    Types: Marijuana    Comment: occasionally    Home Medications Prior to Admission medications   Medication Sig Start Date End Date Taking? Authorizing Provider  aspirin-acetaminophen-caffeine (EXCEDRIN MIGRAINE) [phone] MG per tablet Take 2 tablets by mouth daily as needed for headache.    [provider]  hydrOXYzine (ATARAX/VISTARIL) 10 MG tablet Take 1 tablet (10 mg total) by mouth 2 (two) times daily as needed for anxiety. 09/11/20   Zena Amos, MD  ibuprofen (ADVIL) 200 MG tablet Take 400 mg by mouth every 6 (six) hours as needed for moderate pain.    [provider]  methocarbamol  (ROBAXIN) 500 MG tablet Take 1 tablet (500 mg total) by mouth 2 (two) times daily. Patient not taking: Reported on 07/18/2020 03/27/20   Gailen Shelter, PA  sertraline (ZOLOFT) 50 MG tablet Take 1 tablet (50 mg total) by mouth daily. 09/11/20   Zena Amos, MD    Allergies    Patient has no known allergies.  Review of Systems   Review of Systems  All other systems reviewed and are negative.   Physical Exam Updated Vital Signs BP (!) 127/94   Pulse 79   Temp 98.3 F (36.8 C) (Oral)   Resp 16   SpO2 100%   Physical Exam Vitals and nursing note reviewed.  Constitutional:      General: She is not in acute distress.    Appearance: She is well-developed and well-nourished.  HENT:     Head: Normocephalic and atraumatic.  Eyes:     Conjunctiva/sclera: Conjunctivae normal.  Cardiovascular:     Rate and Rhythm: Normal rate.     Pulses: Normal pulses.  Pulmonary:     Effort: Pulmonary effort is normal. No respiratory distress.     Breath sounds: Normal breath sounds.  Abdominal:     Palpations: Abdomen is soft.     Tenderness: There is no abdominal tenderness.  Musculoskeletal:        General: No edema.     Cervical back: Neck supple.  Comments: <1cm superficial laceration over medial left ankle that appears to be healing well, no significant erythema noted, no drainage noted, no induration, normal ankle ROM, normal DP/PT pulse  Skin:    General: Skin is warm and dry.  Neurological:     Mental Status: She is alert.  Psychiatric:        Mood and Affect: Mood and affect normal.     ED Results / Procedures / Treatments   Labs (all labs ordered are listed, but only abnormal results are displayed) Labs Reviewed - No data to display  EKG None  Radiology No results found.  Procedures Procedures (including critical care time)  Medications Ordered in ED Medications  Tdap (BOOSTRIX) injection 0.5 mL (0.5 mLs Intramuscular Given 12/17/20 1053)    ED Course  I  have reviewed the triage vital signs and the nursing notes.  Pertinent labs & imaging results that were available during my care of the patient were reviewed by me and considered in my medical decision making (see chart for details).    MDM Rules/Calculators/A&P                          35 year old lady came to ER for wound check.  Injury happened over a week ago.  When I assessed the concerned area, it appears to be healing quite well.  There is no evidence for infection, abscess or cellulitis at this time.  Updated tetanus, reviewed return precautions, wound care and discharged.  After the discussed management above, the patient was determined to be safe for discharge.  The patient was in agreement with this plan and all questions regarding their care were answered.  ED return precautions were discussed and the patient will return to the ED with any significant worsening of condition.   Final Clinical Impression(s) / ED Diagnoses Final diagnoses:  Laceration of left foot, initial encounter    Rx / DC Orders ED Discharge Orders    None       Milagros Loll, MD 12/19/20 1134

## 2021-01-06 ENCOUNTER — Ambulatory Visit (INDEPENDENT_AMBULATORY_CARE_PROVIDER_SITE_OTHER): Payer: No Payment, Other | Admitting: Licensed Clinical Social Worker

## 2021-01-06 ENCOUNTER — Other Ambulatory Visit: Payer: Self-pay

## 2021-01-06 DIAGNOSIS — F418 Other specified anxiety disorders: Secondary | ICD-10-CM

## 2021-01-08 NOTE — Progress Notes (Signed)
   THERAPIST PROGRESS NOTE  Session Time: 45 min  Participation Level: Active  Behavioral Response: CasualAlertAnxious  Type of Therapy: Individual Therapy  Treatment Goals addressed: Communication: Depression/Anxiety/Coping  Interventions: Assertiveness Training and Supportive  Summary: Shelley Love is a 35 y.o. female who presents with hx of dep/anx. This date pt comes for in person session. Pt reports she could not get her phone to work and rushed to try to get to appt on time. She is notably anxious and talking very rapidly. LCSW coached in some deep breathing for relaxation. Pt reveals stressors r/t ongoing problems with unemployment and rent/utility assistance. She provides many details and states she believes she may have rent/utilities situation resolved, still working on unemployment. Pt reports issues with phone and unreliable service have created extra stress and limitations.Pt reports no significant changes at work. She has not yet located a second job as she stated she intended to do last session.  LCSW assessed for status of relationship with mother and finances. Pt reports mother is still working but is not giving her what was agreed upon, $200 per wk. Pt has not addressed this with mother as she did one time. Addressed this with assessment, education, consequences/impacts/emotions and assertiveness training. Pt will reflect on discussion and decide what her goals are r/t to changing this dynamic with mother. Reviewed coping strategies and poc prior to close of session. Pt states she has started painting as a positive distraction. Depending on phone situation pt will decide whether to do in person or virtual. Pt states appreciation for care.     Suicidal/Homicidal: Nowithout intent/plan  Therapist Response: Pt remains receptive to care.  Plan: Return again in 4 weeks.  Diagnosis: Axis I: Depression with anxiety    Axis II: Deferred  [place] Sink, LCSW 01/08/2021

## 2021-01-21 ENCOUNTER — Other Ambulatory Visit: Payer: Self-pay

## 2021-01-21 ENCOUNTER — Encounter (HOSPITAL_COMMUNITY): Payer: Self-pay | Admitting: Psychiatry

## 2021-01-21 ENCOUNTER — Other Ambulatory Visit (HOSPITAL_COMMUNITY): Payer: Self-pay | Admitting: Psychiatry

## 2021-01-21 ENCOUNTER — Ambulatory Visit (INDEPENDENT_AMBULATORY_CARE_PROVIDER_SITE_OTHER): Payer: No Payment, Other | Admitting: Psychiatry

## 2021-01-21 VITALS — BP 136/87 | HR 81 | Ht 64.5 in | Wt 157.0 lb

## 2021-01-21 DIAGNOSIS — F339 Major depressive disorder, recurrent, unspecified: Secondary | ICD-10-CM | POA: Diagnosis not present

## 2021-01-21 DIAGNOSIS — F1210 Cannabis abuse, uncomplicated: Secondary | ICD-10-CM

## 2021-01-21 MED ORDER — HYDROXYZINE HCL 10 MG PO TABS: 10.0000 mg | ORAL_TABLET | Freq: Two times a day (BID) | ORAL | 1 refills | Status: DC | PRN

## 2021-01-21 MED FILL — hydrOXYzine HCL 10 MG TABS: 10 | 30 days supply | Qty: 60 | Fill #0

## 2021-01-21 NOTE — Progress Notes (Signed)
BH OP Progress Note   Patient Identification: Shelley Love MRN:  [id number] Date of Evaluation:  01/21/2021    Chief Complaint:  " I am doing somewhat better."   Visit Diagnosis:    ICD-10-CM   1. Major depressive disorder, recurrent episode with anxious distress (HCC)  F33.9   2. Cannabis use disorder, mild, abuse  F12.10     History of Present Illness: Patient seen for follow-up today, she reported that she is doing better now.  She informed that she tried taking sertraline however she took it on empty stomach and as result she had significant GI distress and severe heartburn.  She took it for a few days and then discontinued.  She restarted taking it about a week ago however the same thing happened with the GI's discomfort so she discontinued. Patient stated that she works 2 jobs and her first job starts at 5:30 in the morning.  She does not really get time to eat much in the morning.  She may eat something light for lunch and then the only meal she has is dinner at night.  She still staying busy with all her work commitments. She has been seeing her therapist Ms. Adela Lank regularly and finds that to be very helpful. She did take hydroxyzine 10 mg dose as needed on a few occasions and found that to be very helpful. She stated that for now she would like to continue taking hydroxyzine as needed. Writer advised her to continue doing that and also suggested that in the future if she does decide to restart sertraline then to make sure that she eats a good breakfast before she does that.  Patient verbalized understanding.  She has still been smoking weed intermittently because she finds it helpful for anxiety and mood.  She does not have any intention to discontinue its use.   Past Psychiatric History: Depression, anxiety, 1 ED visit back in August 2021 for suicidal ideations.  Previous Psychotropic Medications: Yes -Brief trial of sertraline 25 mg daily   Past Medical History: No past  medical history on file. No past surgical history on file.  Family Psychiatric History: brother- substance use disorder  Family History: No family history on file.  Social History:   Social History   Socioeconomic History  . Marital status: Single    Spouse name: Not on file  . Number of children: Not on file  . Years of education: Not on file  . Highest education level: Not on file  Occupational History  . Not on file  Tobacco Use  . Smoking status: Current Every Day Smoker    Types: Cigarettes  . Smokeless tobacco: Never Used  . Tobacco comment: occasionally  Vaping Use  . Vaping Use: Former  Substance and Sexual Activity  . Alcohol use: Yes    Comment: occasionally  . Drug use: Yes    Types: Marijuana    Comment: occasionally  . Sexual activity: Yes  Other Topics Concern  . Not on file  Social History Narrative  . Not on file   Social Determinants of Health   Financial Resource Strain: Not on file  Food Insecurity: Not on file  Transportation Needs: Not on file  Physical Activity: Not on file  Stress: Not on file  Social Connections: Not on file    Additional Social History: Currently unemployed, actively looking for a jon. Lives with mom. Has a boyfriend and lives with him sometimes.  Allergies:  No Known Allergies  Metabolic Disorder  Labs: No results found for: HGBA1C, MPG No results found for: PROLACTIN No results found for: CHOL, TRIG, HDL, CHOLHDL, VLDL, LDLCALC No results found for: TSH  Therapeutic Level Labs: No results found for: LITHIUM No results found for: CBMZ No results found for: VALPROATE  Current Medications: Current Outpatient Medications  Medication Sig Dispense Refill  . aspirin-acetaminophen-caffeine (EXCEDRIN MIGRAINE) 250-250-65 MG per tablet Take 2 tablets by mouth daily as needed for headache.    . hydrOXYzine (ATARAX/VISTARIL) 10 MG tablet Take 1 tablet (10 mg total) by mouth 2 (two) times daily as needed for anxiety. 60  tablet 1  . ibuprofen (ADVIL) 200 MG tablet Take 400 mg by mouth every 6 (six) hours as needed for moderate pain.    . methocarbamol (ROBAXIN) 500 MG tablet Take 1 tablet (500 mg total) by mouth 2 (two) times daily. (Patient not taking: Reported on 07/18/2020) 20 tablet 0  . sertraline (ZOLOFT) 50 MG tablet Take 1 tablet (50 mg total) by mouth daily. 30 tablet 1   No current facility-administered medications for this visit.    Musculoskeletal: Strength & Muscle Tone: within normal limits Gait & Station: normal Patient leans: N/A  Psychiatric Specialty Exam: Review of Systems  Blood pressure 136/87, pulse 81, height 5' 4.5" (1.638 m), weight 157 lb (71.2 kg), SpO2 96 %.Body mass index is 26.53 kg/m.  General Appearance: Fairly Groomed  Eye Contact:  Good  Speech:  Clear and Coherent and Normal Rate  Volume:  Normal  Mood:  Anxious  Affect:  Congruent  Thought Process:  Goal Directed and Descriptions of Associations: Intact  Orientation:  Full (Time, Place, and Person)  Thought Content:  Logical  Suicidal Thoughts:  No  Homicidal Thoughts:  No  Memory:  Immediate;   Fair Recent;   Good  Judgement:  Fair  Insight:  Fair  Psychomotor Activity:  Normal  Concentration:  Concentration: Good and Attention Span: Good  Recall:  Good  Fund of Knowledge:Good  Language: Good  Akathisia:  Negative  Handed:  Right  AIMS (if indicated): not done  Assets:  Communication Skills Desire for Improvement Financial Resources/Insurance Housing  ADL's:  Intact  Cognition: WNL  Sleep:  Fair    Flowsheet Row ED from 07/18/2020 in [facility]GENCY DEPT  C-SSRS RISK CATEGORY High Risk      Assessment and Plan: Patient appears to be doing better compared to her first visit.  She tried taking sertraline however developed significant GI distress due to taking it on empty stomach.  She has been taking hydroxyzine 10 mg as needed dose for breakthrough anxiety which is  helpful.  1. Major depressive disorder, recurrent episode with anxious distress (HCC)  -Continue hydrOXYzine (ATARAX/VISTARIL) 10 MG tablet; Take 1 tablet (10 mg total) by mouth 2 (two) times daily as needed for anxiety.  Dispense: 60 tablet; Refill: 1  2. Cannabis Use Disorder -Patient wants to continue using it as it helps her anxiety and mood.  Continue individual therapy with Ms. Adela Lank. Follow-up in 3 months  Zena Amos, MD 2/16/20223:02 PM

## 2021-02-09 ENCOUNTER — Ambulatory Visit (INDEPENDENT_AMBULATORY_CARE_PROVIDER_SITE_OTHER): Payer: No Payment, Other | Admitting: Licensed Clinical Social Worker

## 2021-02-09 ENCOUNTER — Other Ambulatory Visit: Payer: Self-pay

## 2021-02-09 DIAGNOSIS — F418 Other specified anxiety disorders: Secondary | ICD-10-CM | POA: Diagnosis not present

## 2021-02-11 NOTE — Progress Notes (Signed)
   THERAPIST PROGRESS NOTE  Session Time: 45 min  Participation Level: Active  Behavioral Response: CasualAlertAnxious and Depressed  Type of Therapy: Individual Therapy  Treatment Goals addressed: Communication: dep/anx/coping  Interventions: Solution Focused, Assertiveness Training and Supportive  Summary: Janetta Vandoren is a 35 y.o. female who presents with hx of dep/anx. This date pt comes for in person session. LCSW acknowledged pt's recent birthday. She states her mother did get her a cake and her boyfriend took her out to eat and had cake, older brother acknowledged birthday as well. Pt states she found herself feeling down d/t position she is in with life. Pt revisits how she has used all of her savings and gone in debt to support mother/brother. She admits she is very angry about ongoing situation with mother. Pt reports she did speak with mother about paying her regularly. Mother has now quit job. LCSW assessed if pt was able to come up with her goal r/t relationship with mother. Pt states "seperation". She wants to have her own place to live and mother to have her own place to live along with her mother's own financial independence. Addressed this in some detail. Pt agrees to write out her action steps to achieve goal prior to next session. Reviewed realistic expectations r/t to mother's potential reactions and coping with same. Pt reports she has stopped calling her father as this too is a one sided relationship. Pt reports she is more displeased with her work at [employer] and is applying for other jobs more actively. Pt states she has had several interviews and Erenest Rasher is doing a background check on her now. Pt speaks of her desire to attend school and is talking to an advisor. Pt denied unemployment and plans to appeal. Pt reports problems with sleep. She agrees to address this with med management provider. Pt states she has started painting as a positive distraction for coping. She will take  some pics of her painting to share next session. LCSW reviewed poc including scheduling prior to close of session. Pt states appreciation for care.    Suicidal/Homicidal: Nowithout intent/plan  Therapist Response: Pt remains receptive and responsive to care.  Plan: Return again in ~3 weeks.  Diagnosis: Axis I: Depression with anxiety    Axis II: Deferred  [place] Sink, LCSW 02/11/2021

## 2021-03-05 ENCOUNTER — Other Ambulatory Visit: Payer: Self-pay

## 2021-03-05 ENCOUNTER — Ambulatory Visit (INDEPENDENT_AMBULATORY_CARE_PROVIDER_SITE_OTHER): Payer: No Payment, Other | Admitting: Licensed Clinical Social Worker

## 2021-03-05 DIAGNOSIS — F418 Other specified anxiety disorders: Secondary | ICD-10-CM | POA: Diagnosis not present
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
## 2021-03-24 ENCOUNTER — Other Ambulatory Visit: Payer: Self-pay

## 2021-03-24 ENCOUNTER — Ambulatory Visit (INDEPENDENT_AMBULATORY_CARE_PROVIDER_SITE_OTHER): Payer: No Payment, Other | Admitting: Licensed Clinical Social Worker

## 2021-03-24 DIAGNOSIS — F418 Other specified anxiety disorders: Secondary | ICD-10-CM

## 2021-03-26 NOTE — Progress Notes (Signed)
   THERAPIST PROGRESS NOTE   Virtual Visit via Video Note  I connected with [name] on 03/24/21 at 10:00 AM EDT by a video enabled telemedicine application and verified that I am speaking with the correct person using two identifiers.  Location: Patient: In her car outside of her home. Provider: [facility]   I discussed the limitations of evaluation and management by telemedicine and the availability of in person appointments. The patient expressed understanding and agreed to proceed. I discussed the assessment and treatment plan with the patient. The patient was provided an opportunity to ask questions and all were answered. The patient agreed with the plan and demonstrated an understanding of the instructions.  I provided  minutes of non-face-to-face time during this encounter.  Participation Level: Active  Behavioral Response: CasualAlertAnxious and Depressed  Type of Therapy: Individual Therapy  Treatment Goals addressed: Communication: dep/anx/coping  Interventions: Supportive  Summary: Shelley Love is a 35 y.o. female who presents with hx of dep/anx.This date pt signs on for video session in her car. She reports she is just getting in from being at her boyfriend's home and waiting to go inside as her multiple pets will be demanding of her attention. Pt states she is "overwhelmed". She reports she continues to have significant financial problems/stress. She states her mother is out of work for next wk d/t carpal tunnel issues. She did get April rent paid but is feeling the pressure of the new month upon her. Pt believes she may get her tax refund by end of wk. Pt states she is looking for work again. Has decided she does not like her current position in new job after all. 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 Pt states she was fired for attendance reasons. She states this is same co, [address], but a different plant. Pt hopeful this  will work out. Pt states she has been thinking more about trying to go back to school and have more of a "career". LCSW provided active and supportive listening, provided encouragement for school goal and interview tomorrow. Pt taking meds as prescribed. Pt reports she is using [employer] to help her stay more organized. Also reports she is in an online book club helping with coping, positive distraction. Boyfriend remains supportive. Pt denies other worries/concerns. LCSW reviewed poc prior to close of session. Pt states appreciation for care.   Suicidal/Homicidal: Nowithout intent/plan  Therapist Response: Pt remains receptive to care.  Plan: Return again in ~4 weeks.  Diagnosis: Axis I: Depression with anxiety  [place] Sink, LCSW 03/26/2021

## 2021-04-20 ENCOUNTER — Telehealth (INDEPENDENT_AMBULATORY_CARE_PROVIDER_SITE_OTHER): Payer: No Payment, Other | Admitting: Psychiatry

## 2021-04-20 ENCOUNTER — Encounter (HOSPITAL_COMMUNITY): Payer: Self-pay | Admitting: Psychiatry

## 2021-04-20 ENCOUNTER — Other Ambulatory Visit: Payer: Self-pay

## 2021-04-20 DIAGNOSIS — F1210 Cannabis abuse, uncomplicated: Secondary | ICD-10-CM

## 2021-04-20 DIAGNOSIS — F339 Major depressive disorder, recurrent, unspecified: Secondary | ICD-10-CM | POA: Diagnosis not present

## 2021-04-20 MED ORDER — SERTRALINE HCL 50 MG PO TABS
ORAL_TABLET | Freq: Every day | ORAL | 1 refills | Status: DC
  Filled 2021-04-20: qty 30, 30d supply, fill #0

## 2021-04-20 MED ORDER — HYDROXYZINE HCL 10 MG PO TABS
ORAL_TABLET | ORAL | 1 refills | Status: DC
  Filled 2021-04-20: qty 60, fill #0

## 2021-04-20 NOTE — Progress Notes (Signed)
BH OP Progress Note   Virtual Visit via Telephone Note  I connected with [name] on 04/20/21 at  1:20 PM EDT by telephone and verified that I am speaking with the correct person using two identifiers.  Location: Patient: home Provider: Clinic   I discussed the limitations, risks, security and privacy concerns of performing an evaluation and management service by telephone and the availability of in person appointments. I also discussed with the patient that there may be a patient responsible charge related to this service. The patient expressed understanding and agreed to proceed.   I provided 15 minutes of non-face-to-face time during this encounter.   Patient Identification: Shelley Love MRN:  [id number] Date of Evaluation:  04/20/2021    Chief Complaint:  " I am doing better."   Visit Diagnosis:    ICD-10-CM   1. Major depressive disorder, recurrent episode with anxious distress (HCC)  F33.9 sertraline (ZOLOFT) 50 MG tablet    hydrOXYzine (ATARAX/VISTARIL) 10 MG tablet  2. Cannabis use disorder, mild, abuse  F12.10     History of Present Illness: Patient reported that she is doing better than before.  She stated that after she spoke with the writer she started to review her diet and noticed that she was eating a lot of food items that can contribute to acid reflux.  She stated that she started working on those food items and then started taking sertraline with breakfast in the morning and that has helped her symptoms of depression anxiety significantly.  She takes hydroxyzine only as needed for breakthrough anxiety and that helps 2. She is sleeping okay for now. She stated she would like to keep taking the same regimen for now as it has been helpful. She has been seeing her therapist Ms. Hatch regularly. She denied any other concerns or issues today.  Past Psychiatric History: Depression, anxiety, 1 ED visit back in August 2021 for suicidal ideations.  Previous  Psychotropic Medications: Yes -Brief trial of sertraline 25 mg daily   Past Medical History: No past medical history on file. No past surgical history on file.  Family Psychiatric History: brother- substance use disorder  Family History: No family history on file.  Social History:   Social History   Socioeconomic History  . Marital status: Single    Spouse name: Not on file  . Number of children: Not on file  . Years of education: Not on file  . Highest education level: Not on file  Occupational History  . Not on file  Tobacco Use  . Smoking status: Current Every Day Smoker    Types: Cigarettes  . Smokeless tobacco: Never Used  . Tobacco comment: occasionally  Vaping Use  . Vaping Use: Former  Substance and Sexual Activity  . Alcohol use: Yes    Comment: occasionally  . Drug use: Yes    Types: Marijuana    Comment: occasionally  . Sexual activity: Yes  Other Topics Concern  . Not on file  Social History Narrative  . Not on file   Social Determinants of Health   Financial Resource Strain: Not on file  Food Insecurity: Not on file  Transportation Needs: Not on file  Physical Activity: Not on file  Stress: Not on file  Social Connections: Not on file    Additional Social History: Currently unemployed, actively looking for a jon. Lives with mom. Has a boyfriend and lives with him sometimes.  Allergies:  No Known Allergies  Metabolic Disorder Labs: No results  found for: HGBA1C, MPG No results found for: PROLACTIN No results found for: CHOL, TRIG, HDL, CHOLHDL, VLDL, LDLCALC No results found for: TSH  Therapeutic Level Labs: No results found for: LITHIUM No results found for: CBMZ No results found for: VALPROATE  Current Medications: Current Outpatient Medications  Medication Sig Dispense Refill  . aspirin-acetaminophen-caffeine (EXCEDRIN MIGRAINE) 250-250-65 MG per tablet Take 2 tablets by mouth daily as needed for headache.    . hydrOXYzine  (ATARAX/VISTARIL) 10 MG tablet TAKE 1 TABLET (10 MG TOTAL) BY MOUTH 2 (TWO) TIMES DAILY AS NEEDED FOR ANXIETY. 60 tablet 1  . ibuprofen (ADVIL) 200 MG tablet Take 400 mg by mouth every 6 (six) hours as needed for moderate pain.    . methocarbamol (ROBAXIN) 500 MG tablet Take 1 tablet (500 mg total) by mouth 2 (two) times daily. (Patient not taking: Reported on 07/18/2020) 20 tablet 0  . sertraline (ZOLOFT) 50 MG tablet TAKE 1 TABLET (50 MG TOTAL) BY MOUTH DAILY. 30 tablet 1   No current facility-administered medications for this visit.    Musculoskeletal: Strength & Muscle Tone: within normal limits Gait & Station: normal Patient leans: N/A  Psychiatric Specialty Exam: Review of Systems  There were no vitals taken for this visit.There is no height or weight on file to calculate BMI.  General Appearance: unable to assess due to phone visit  Eye Contact:  unable to assess due to phone visit  Speech:  Clear and Coherent and Normal Rate  Volume:  Normal  Mood:  Euthymic  Affect:  Congruent  Thought Process:  Goal Directed and Descriptions of Associations: Intact  Orientation:  Full (Time, Place, and Person)  Thought Content:  Logical  Suicidal Thoughts:  No  Homicidal Thoughts:  No  Memory:  Immediate;   Fair Recent;   Good  Judgement:  Fair  Insight:  Fair  Psychomotor Activity:  Normal  Concentration:  Concentration: Good and Attention Span: Good  Recall:  Good  Fund of Knowledge:Good  Language: Good  Akathisia:  Negative  Handed:  Right  AIMS (if indicated): not done  Assets:  Communication Skills Desire for Improvement Financial Resources/Insurance Housing  ADL's:  Intact  Cognition: WNL  Sleep:  Fair    Flowsheet Row ED from 07/18/2020 in [facility]  C-SSRS RISK CATEGORY High Risk      Assessment and Plan: Patient informed that after improving her dietary habits she has started taking sertraline regularly with breakfast in the  morning and it has helped her symptoms of depression anxiety well.  She would like to continue taking same regimen for now.  1. Major depressive disorder, recurrent episode with anxious distress (HCC)  - sertraline (ZOLOFT) 50 MG tablet; TAKE 1 TABLET (50 MG TOTAL) BY MOUTH DAILY.  Dispense: 30 tablet; Refill: 1 - hydrOXYzine (ATARAX/VISTARIL) 10 MG tablet; TAKE 1 TABLET (10 MG TOTAL) BY MOUTH 2 (TWO) TIMES DAILY AS NEEDED FOR ANXIETY.  Dispense: 60 tablet; Refill: 1  2. Cannabis Use Disorder -Patient was advised to abstain from its use.  Continue individual therapy with Ms. Adela Lank. Follow-up in 2 months.  Patient was informed that her care is being transferred to a different provider due to the writer leaving the office.  Zena Amos, MD 5/16/20221:30 PM

## 2021-04-27 ENCOUNTER — Other Ambulatory Visit: Payer: Self-pay

## 2021-04-27 ENCOUNTER — Emergency Department (HOSPITAL_COMMUNITY)
Admission: EM | Admit: 2021-04-27 | Discharge: 2021-04-27 | Disposition: A | Discharge status: Home / Self Care | Payer: Self-pay | Attending: Emergency Medicine | Admitting: Emergency Medicine

## 2021-04-27 ENCOUNTER — Encounter (HOSPITAL_COMMUNITY): Payer: Self-pay

## 2021-04-27 ENCOUNTER — Emergency Department (HOSPITAL_COMMUNITY): Payer: Self-pay

## 2021-04-27 DIAGNOSIS — Z87891 Personal history of nicotine dependence: Secondary | ICD-10-CM | POA: Insufficient documentation

## 2021-04-27 DIAGNOSIS — Z7982 Long term (current) use of aspirin: Secondary | ICD-10-CM | POA: Insufficient documentation

## 2021-04-27 DIAGNOSIS — Z20822 Contact with and (suspected) exposure to covid-19: Secondary | ICD-10-CM | POA: Insufficient documentation

## 2021-04-27 DIAGNOSIS — J069 Acute upper respiratory infection, unspecified: Secondary | ICD-10-CM | POA: Insufficient documentation

## 2021-04-27 LAB — RESP PANEL BY RT-PCR (FLU A&B, COVID) ARPGX2
Influenza A by PCR: NEGATIVE
Influenza B by PCR: NEGATIVE
SARS Coronavirus 2 by RT PCR: NEGATIVE

## 2021-04-27 LAB — GROUP A STREP BY PCR: Group A Strep by PCR: NOT DETECTED

## 2021-04-27 NOTE — ED Provider Notes (Signed)
[facility]-EMERGENCY DEPT Provider Note   CSN: [id number] Arrival date & time: 04/27/21  5732     History Chief Complaint  Patient presents with  . Otalgia  . Sore Throat    Shelley Love is a 35 y.o. female presents to the ED for sore throat, sneezing, runny nose, cough that is productive, headaches, subjective fevers, body aches and chills, diarrhea, generalized abdominal pain that radiates into her chest and her hips, nausea, extreme fatigue.  Feels a tightness in her lower chest and abdomen causing her to feel short of breath.  She has generalized abdominal pain as well that is mild and constant.  States everyone around her has a "little cold" and today her boyfriend told her that his throat is a little scratchy.  She did a home COVID test and it was negative.  She denies vomiting.  Has tried Tylenol, Chloraseptic throat spray, warm water and honey without improvement in her symptoms.  Has not been vaccinated for COVID or influenza.  She had COVID in December 2018?  Or 2020.  Does not remember.  No history of asthma or any other lung or heart problems.  Does not take any daily medicines.   HPI     History reviewed. No pertinent past medical history.  Patient Active Problem List   Diagnosis Date Noted  . Major depressive disorder, recurrent episode with anxious distress (HCC) 09/11/2020  . Cannabis use disorder, mild, abuse 09/11/2020  . Anxiety 07/18/2020    History reviewed. No pertinent surgical history.   OB History   No obstetric history on file.     History reviewed. No pertinent family history.  Social History   Tobacco Use  . Smoking status: Former Smoker    Types: Cigarettes  . Smokeless tobacco: Never Used  . Tobacco comment: occasionally  Vaping Use  . Vaping Use: Former  Substance Use Topics  . Alcohol use: Yes    Comment: occasionally  . Drug use: Yes    Types: Marijuana    Comment: occasionally    Home Medications Prior  to Admission medications   Medication Sig Start Date End Date Taking? Authorizing Provider  aspirin-acetaminophen-caffeine (EXCEDRIN MIGRAINE) [phone] MG per tablet Take 2 tablets by mouth daily as needed for headache.    [provider]  hydrOXYzine (ATARAX/VISTARIL) 10 MG tablet TAKE 1 TABLET (10 MG TOTAL) BY MOUTH 2 (TWO) TIMES DAILY AS NEEDED FOR ANXIETY. 04/20/21 04/20/22  Zena Amos, MD  ibuprofen (ADVIL) 200 MG tablet Take 400 mg by mouth every 6 (six) hours as needed for moderate pain.    [provider]  methocarbamol (ROBAXIN) 500 MG tablet Take 1 tablet (500 mg total) by mouth 2 (two) times daily. Patient not taking: Reported on 07/18/2020 03/27/20   Gailen Shelter, PA  sertraline (ZOLOFT) 50 MG tablet TAKE 1 TABLET (50 MG TOTAL) BY MOUTH DAILY. 04/20/21 04/20/22  Zena Amos, MD    Allergies    Patient has no known allergies.  Review of Systems   Review of Systems  Constitutional: Positive for chills, fatigue and fever (Subjective).  HENT: Positive for congestion, postnasal drip, rhinorrhea and tinnitus.   Respiratory: Positive for choking and chest tightness.   Gastrointestinal: Positive for diarrhea.  Musculoskeletal: Positive for myalgias.  Neurological: Positive for headaches.  All other systems reviewed and are negative.   Physical Exam Updated Vital Signs BP (!) 141/78   Pulse 90   Temp 99 F (37.2 C) (Oral)   Resp  18   Ht 5' 4.5" (1.638 m)   Wt 68 kg   LMP 04/24/2021   SpO2 98%   BMI 25.35 kg/m   Physical Exam Vitals and nursing note reviewed.  Constitutional:      General: She is not in acute distress.    Appearance: She is well-developed.     Comments: NAD.  HENT:     Head: Normocephalic and atraumatic.     Right Ear: Tympanic membrane and external ear normal.     Left Ear: Tympanic membrane and external ear normal.     Nose: Congestion and rhinorrhea present.     Mouth/Throat:     Mouth: Mucous membranes are moist.      Pharynx: Posterior oropharyngeal erythema present.     Tonsils: 1+ on the right. 1+ on the left.     Comments: Easily visualized oropharynx, tonsils uvula which diffusely erythematous but no edema, petechia.  Uvula is midline.  Normal sublingual space.  Tolerating secretions, no stridor. Eyes:     General: No scleral icterus.    Conjunctiva/sclera: Conjunctivae normal.  Cardiovascular:     Rate and Rhythm: Normal rate and regular rhythm.     Heart sounds: Normal heart sounds. No murmur heard.   Pulmonary:     Effort: Pulmonary effort is normal.     Breath sounds: Normal breath sounds. No wheezing.  Abdominal:     Tenderness: There is no abdominal tenderness.  Musculoskeletal:        General: No deformity. Normal range of motion.     Cervical back: Normal range of motion and neck supple.  Skin:    General: Skin is warm and dry.     Capillary Refill: Capillary refill takes less than 2 seconds.  Neurological:     Mental Status: She is alert and oriented to person, place, and time.  Psychiatric:        Behavior: Behavior normal.        Thought Content: Thought content normal.        Judgment: Judgment normal.     ED Results / Procedures / Treatments   Labs (all labs ordered are listed, but only abnormal results are displayed) Labs Reviewed  GROUP A STREP BY PCR  RESP PANEL BY RT-PCR (FLU A&B, COVID) [id number]    EKG None  Radiology DG Chest 1 View  Result Date: 04/27/2021 CLINICAL DATA:  34 year old female with cough congestion and shortness of breath for 1 week. EXAM: CHEST  1 VIEW COMPARISON:  None. FINDINGS: Portable AP semi upright view at 1115 hours. Lung volumes and mediastinal contours are within normal limits, possible mild pectus deformity. Visualized tracheal air column is within normal limits. Allowing for portable technique the lungs are clear. No pneumothorax or pleural effusion. No osseous abnormality identified. Negative visible bowel gas pattern. IMPRESSION:  Negative portable chest. Electronically Signed   By: Odessa Fleming M.D.   On: 04/27/2021 11:46    Procedures Procedures   Medications Ordered in ED Medications - No data to display  ED Course  I have reviewed the triage vital signs and the nursing notes.  Pertinent labs & imaging results that were available during my care of the patient were reviewed by me and considered in my medical decision making (see chart for details).    MDM Rules/Calculators/A&P                          35 year old healthy female presents to  the ED for URI symptoms for the last 1 week.  Several sick contacts at home including her boyfriend who developed a sore throat today.  Had COVID already.  Has not been vaccinated for COVID or influenza.  Exam is overall benign.  Slight tachycardia but no fever.  Lungs are clear.  No significant abnormalities on throat exam to suggest deep space infection.  We will test for COVID, strep.  Given duration of symptoms with productive cough, borderline tachycardia and chest discomfort will obtain a chest x-ray as well although her lung exam is normal today.  Her abdominal pain radiating into the chest and hips unlikely to be ACS, considered this, PE, unlikely.  Abdomen is non tender. If work-up benign anticipate discharge with symptomatic management.  This was discussed with patient who is in agreement.  She is aware she can follow-up on her COVID/flu results on her MyChart account.  1255: Strep, respiratory panel negative. CXR without acute findings.  Will proceed with discharge and symptomatic management of likely viral etiology.   Final Clinical Impression(s) / ED Diagnoses Final diagnoses:  Upper respiratory tract infection, unspecified type    Rx / DC Orders ED Discharge Orders    None       Liberty Handy, PA-C 04/27/21 1258    Derwood Kaplan, MD 04/28/21 [phone]

## 2021-04-27 NOTE — Discharge Instructions (Addendum)
Your chest x-ray is normal. COVID, influenza and strep tests are all negative.    Your symptoms are most likely from another virus. A viral illness typically peaks on day 2-3 and resolves after one week.  A viral illness can cause upper respiratory symptoms (runny nose, sore throat, cough) or GI symptoms (nausea, vomiting, diarrhea)   The main treatment approach for a viral illness is supportive, addressing your symptoms, support your immune system and prevent spread of illness.    Stay well-hydrated. Rest. You can use over the counter medications to help with symptoms: 600 mg ibuprofen (motrin, aleve, advil) or acetaminophen (tylenol) every 6 hours, around the clock to help with associated fevers, sore throat, headaches, generalized body aches and malaise. Do not take ibuprofen if you are pregnant, have kidney disease, have stomach ulcers or severe gastritis, or are on anticoagulants. Do not take acetaminophen if you have liver disease. Oxymetazoline (afrin) intranasal spray once daily for no more than 3 days to help with congestion, after 3 days you can switch to another over-the-counter nasal steroid spray such as fluticasone (flonase) Allergy medication (loratadine, cetirizine, etc) and phenylephrine (sudafed) help with nasal congestion, runny nose and postnasal drip.   Dextromethorphan (Delsym) to suppress dry cough  Guaifenesin (mucinex) to help with built up mucus in chest and productive cough Wash your hands often to prevent spread.    A viral illness usually resolved in 7-10 days.  A viral illness can also worsen and progress into pneumonia, severe dehydration.  Monitor your symptoms. Return for persistent fevers, chest pain or shortness of breath with activity or exertion, worsening productive or bloody cough, inability to tolerate fluids despite nausea medicines or dehydration, severe non stop vomiting or diarrhea, blood in your vomit or stool

## 2021-04-27 NOTE — ED Triage Notes (Addendum)
Patient c/o sore throat, cough, bilateral ear ache,headache,chills, diarrhea, and body aches 1 week ago. Patient states she did a home Covid test and it was negative.

## 2021-04-27 NOTE — ED Notes (Signed)
An After Visit Summary was printed and given to the patient. Discharge instructions given and no further questions at this time.  

## 2021-04-30 ENCOUNTER — Other Ambulatory Visit: Payer: Self-pay

## 2021-04-30 ENCOUNTER — Ambulatory Visit (INDEPENDENT_AMBULATORY_CARE_PROVIDER_SITE_OTHER): Payer: No Payment, Other | Admitting: Licensed Clinical Social Worker

## 2021-04-30 DIAGNOSIS — F418 Other specified anxiety disorders: Secondary | ICD-10-CM | POA: Diagnosis not present

## 2021-05-01 ENCOUNTER — Ambulatory Visit: Payer: Self-pay | Admitting: *Deleted

## 2021-05-01 NOTE — Telephone Encounter (Signed)
This pt called in c/o not feeling any better.   She was seen in the ED on 04/27/2021 and diagnosed with a URI per pt.  (She didn't tell me this until near the end of the triage protocol I was using for chest pain).   She is calling in because she is not feeling any better and wants someone to call in an antibiotic for her.  I let her know she would need to go to an urgent care or the ED in order for someone to order antibiotics for her.   She does not have a PCP.  She is agreeable to going to the urgent care.  Reason for Disposition . [1] Chest pain(s) lasting a few seconds from coughing AND [2] persists > 3 days  Answer Assessment - Initial Assessment Questions 1. LOCATION: "Where does it hurt?"       I have an URI on day 11 now.   I woke up coughing this morning and with a headache on the left side and I've vomited twice today.   I'm not feeling any better.   I need some antibiotics.   She does not have a PCP. 2. RADIATION: "Does the pain go anywhere else?" (e.g., into neck, jaw, arms, back)     I have neck pain and sharp pain in my shoulder on the right.   I have an old neck injury that hurts at times.  It may be that.   3. ONSET: "When did the chest pain begin?" (Minutes, hours or days)      I'm coughing up and blowing out a lot of mucus.   My covid test was negative this past Monday at the ED.   Wed. I did a home covid test and it was negative. I came to the ED coughing, shortness of breath and vomiting on Sunday.   I'm just not getting better and feel like I need antibiotics.    Can someone call some in for me? 4. PATTERN "Does the pain come and go, or has it been constant since it started?"  "Does it get worse with exertion?"      Constant coughing and nasal congestion 5. DURATION: "How long does it last" (e.g., seconds, minutes, hours)     TRIAGE ENDED HERE.   SHE WAS SEEN IN THE ED AND DIAGNOSED WITH A URI.    SHE IS NOT FEELING BETTER AND WANTS AN ANTIBIOTIC CALLED IN. 6. SEVERITY: "How  bad is the pain?"  (e.g., Scale 1-10; mild, moderate, or severe)    - MILD (1-3): doesn't interfere with normal activities     - MODERATE (4-7): interferes with normal activities or awakens from sleep    - SEVERE (8-10): excruciating pain, unable to do any normal activities       *No Answer* 7. CARDIAC RISK FACTORS: "Do you have any history of heart problems or risk factors for heart disease?" (e.g., angina, prior heart attack; diabetes, high blood pressure, high cholesterol, smoker, or strong family history of heart disease)     *No Answer* 8. PULMONARY RISK FACTORS: "Do you have any history of lung disease?"  (e.g., blood clots in lung, asthma, emphysema, birth control pills)     *No Answer* 9. CAUSE: "What do you think is causing the chest pain?"     *No Answer* 10. OTHER SYMPTOMS: "Do you have any other symptoms?" (e.g., dizziness, nausea, vomiting, sweating, fever, difficulty breathing, cough)       *No Answer* 11. PREGNANCY: "  Is there any chance you are pregnant?" "When was your last menstrual period?"       *No Answer*  Protocols used: CHEST PAIN-A-AH
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
## 2021-05-13 ENCOUNTER — Ambulatory Visit (HOSPITAL_COMMUNITY): Payer: No Payment, Other | Admitting: Licensed Clinical Social Worker

## 2021-06-18 ENCOUNTER — Other Ambulatory Visit: Payer: Self-pay

## 2021-06-18 ENCOUNTER — Telehealth (INDEPENDENT_AMBULATORY_CARE_PROVIDER_SITE_OTHER): Payer: No Payment, Other | Admitting: Physician Assistant

## 2021-06-18 DIAGNOSIS — F339 Major depressive disorder, recurrent, unspecified: Secondary | ICD-10-CM | POA: Diagnosis not present

## 2021-06-18 MED ORDER — SERTRALINE HCL 100 MG PO TABS
100.0000 mg | ORAL_TABLET | Freq: Every day | ORAL | 1 refills | Status: DC
  Filled 2021-06-18: qty 30, 30d supply, fill #0
  Filled 2021-07-31: qty 30, 30d supply, fill #1

## 2021-06-18 MED ORDER — HYDROXYZINE HCL 10 MG PO TABS
ORAL_TABLET | ORAL | 1 refills | Status: DC
  Filled 2021-06-18: qty 60, 30d supply, fill #0
  Filled 2021-07-31: qty 60, 30d supply, fill #1

## 2021-06-18 NOTE — Progress Notes (Signed)
BH MD/PA/NP OP Progress Note  Virtual Visit via Telephone Note  I connected with [name] on 06/18/21 at 11:00 AM EDT by telephone and verified that I am speaking with the correct person using two identifiers.  Location: Patient: Home Provider: Clinic   I discussed the limitations, risks, security and privacy concerns of performing an evaluation and management service by telephone and the availability of in person appointments. I also discussed with the patient that there may be a patient responsible charge related to this service. The patient expressed understanding and agreed to proceed.  Follow Up Instructions:  I discussed the assessment and treatment plan with the patient. The patient was provided an opportunity to ask questions and all were answered. The patient agreed with the plan and demonstrated an understanding of the instructions.   The patient was advised to call back or seek an in-person evaluation if the symptoms worsen or if the condition fails to improve as anticipated.  I provided 20 minutes of non-face-to-face time during this encounter.  Meta Hatchet, PA   06/18/2021 11:21 AM [name]  MRN:  [id number]  Chief Complaint: Follow up and medication management  HPI:   Shelley Love is a 35 year old female with a past psychiatric history significant for major depressive disorder who presents to [facility] for virtual telephone visit for follow-up and medication management.  Patient is currently being managed on the following medications:  Sertraline 50 mg daily Hydroxyzine 10 mg 2 times daily as needed  Patient states that she had a rough start with the medication when she first started taking them, every so often going a day without taking her medications.  She reports that her symptoms have been much better since since taking her medications more consistently.  Patient still endorses anxiety she rates a 5 out of  10.  Stressors to her anxiety include not having a job, bills, and an upcoming court date regarding her eviction.   Patient states that her depressive symptoms are very subtle and that she tries not to focus on it as much.  Patient is currently preparing to work at a [employer] once she passes her drug screen.  Patient is also setting herself up to go back to school. A PHQ-9 screen was performed with the patient scoring a 12.  A GAD-7 screen was also performed with the patient scoring to 20.  Patient is alert and oriented x4, calm, cooperative, and fully engaged in conversation during the encounter.  Patient states that she is very anxious yet hopeful.  Patient denies suicidal or homicidal ideations.  She further denies auditory or visual hallucinations and does not appear to be responding to internal/external stimuli.  Patient endorses fair sleep and receives on average 6 hours of sleep each night.  Patient states that she often tosses and turns in her sleep and finds herself waking up constantly.  Patient endorses poor appetite and eats on average 1 meal per day.  Patient endorses alcohol consumption sparingly.  Patient denies tobacco use and illicit drug use.  Visit Diagnosis:    ICD-10-CM   1. Major depressive disorder, recurrent episode with anxious distress (HCC)  F33.9 sertraline (ZOLOFT) 100 MG tablet    hydrOXYzine (ATARAX/VISTARIL) 10 MG tablet      Past Psychiatric History:  Major depressive disorder  Past Medical History: No past medical history on file. No past surgical history on file.  Family Psychiatric History:  Brother- substance use disorder  Family  History: No family history on file.  Social History:  Social History   Socioeconomic History   Marital status: Single    Spouse name: Not on file   Number of children: Not on file   Years of education: Not on file   Highest education level: Not on file  Occupational History   Not on file  Tobacco Use    Smoking status: Former    Types: Cigarettes   Smokeless tobacco: Never   Tobacco comments:    occasionally  Vaping Use   Vaping Use: Former  Substance and Sexual Activity   Alcohol use: Yes    Comment: occasionally   Drug use: Yes    Types: Marijuana    Comment: occasionally   Sexual activity: Yes  Other Topics Concern   Not on file  Social History Narrative   Not on file   Social Determinants of Health   Financial Resource Strain: Not on file  Food Insecurity: Not on file  Transportation Needs: Not on file  Physical Activity: Not on file  Stress: Not on file  Social Connections: Not on file    Allergies: No Known Allergies  Metabolic Disorder Labs: No results found for: HGBA1C, MPG No results found for: PROLACTIN No results found for: CHOL, TRIG, HDL, CHOLHDL, VLDL, LDLCALC No results found for: TSH  Therapeutic Level Labs: No results found for: LITHIUM No results found for: VALPROATE No components found for:  CBMZ  Current Medications: Current Outpatient Medications  Medication Sig Dispense Refill   aspirin-acetaminophen-caffeine (EXCEDRIN MIGRAINE) 250-250-65 MG per tablet Take 2 tablets by mouth daily as needed for headache.     hydrOXYzine (ATARAX/VISTARIL) 10 MG tablet TAKE 1 TABLET (10 MG TOTAL) BY MOUTH 2 (TWO) TIMES DAILY AS NEEDED FOR ANXIETY. 60 tablet 1   ibuprofen (ADVIL) 200 MG tablet Take 400 mg by mouth every 6 (six) hours as needed for moderate pain.     methocarbamol (ROBAXIN) 500 MG tablet Take 1 tablet (500 mg total) by mouth 2 (two) times daily. (Patient not taking: Reported on 07/18/2020) 20 tablet 0   sertraline (ZOLOFT) 100 MG tablet Take 1 tablet (100 mg total) by mouth daily. 30 tablet 1   No current facility-administered medications for this visit.     Musculoskeletal: Strength & Muscle Tone: Unable to assess due to telemedicine visit Gait & Station: Unable to assess due to telemedicine visit Patient leans: Unable to assess due to  telemedicine visit  Psychiatric Specialty Exam: Review of Systems  Psychiatric/Behavioral:  Positive for sleep disturbance. Negative for decreased concentration, dysphoric mood, hallucinations, self-injury and suicidal ideas. The patient is nervous/anxious. The patient is not hyperactive.    There were no vitals taken for this visit.There is no height or weight on file to calculate BMI.  General Appearance: Unable to assess due to telemedicine visit  Eye Contact:  Unable to assess due to telemedicine visit  Speech:  Clear and Coherent and Normal Rate  Volume:  Normal  Mood:  Anxious and Depressed  Affect:  Congruent and Depressed  Thought Process:  Coherent and Descriptions of Associations: Intact  Orientation:  Full (Time, Place, and Person)  Thought Content: WDL   Suicidal Thoughts:  No  Homicidal Thoughts:  No  Memory:  Immediate;   Good Recent;   Good Remote;   Fair  Judgement:  Fair  Insight:  Fair  Psychomotor Activity:  Normal  Concentration:  Concentration: Good and Attention Span: Good  Recall:  Good  Fund of  Knowledge: Good  Language: Good  Akathisia:  NA  Handed:  Right  AIMS (if indicated): not done  Assets:  Communication Skills Desire for Improvement Financial Resources/Insurance Housing  ADL's:  Intact  Cognition: WNL  Sleep:  Fair   Screenings: GAD-7    Flowsheet Row Video Visit from 06/18/2021 in [facility]  Total GAD-7 Score 20      PHQ2-9    Flowsheet Row Video Visit [facility]18/2021 in [facility]  PHQ-2 Total Score 2  PHQ-9 Total Score 12      Flowsheet Row Video Visit from 06/18/2021 in [facility] ED from 04/27/2021 in [facility] from 07/18/2020 in [facility]  C-SSRS RISK CATEGORY Low Risk No Risk High Risk        Assessment and Plan:   Shelley Love is a 35 year old female with  a past psychiatric history significant for major depressive disorder who presents to [facility] for virtual telephone visit for follow-up and medication management.  Patient reports that her depressive symptoms have improved some but she still continues to experience subtle episodes along with some anxiety.  Patient's anxiety appears to be due to a number of stressors in her life.  Patient was recommended increasing her sertraline dosage from 50 mg to 100 mg daily for the management of her depressive symptoms and anxiety.  Patient was agreeable to recommendation.  Patient's medications to be e-prescribed to pharmacy of choice.  1. Major depressive disorder, recurrent episode with anxious distress (HCC)  - sertraline (ZOLOFT) 100 MG tablet; Take 1 tablet (100 mg total) by mouth daily.  Dispense: 30 tablet; Refill: 1 - hydrOXYzine (ATARAX/VISTARIL) 10 MG tablet; TAKE 1 TABLET (10 MG TOTAL) BY MOUTH 2 (TWO) TIMES DAILY AS NEEDED FOR ANXIETY.  Dispense: 60 tablet; Refill: 1  Patient to follow up in 2 months Provider spent a total of 20 minutes with the patient/reviewing the patient's chart  Meta Hatchet, PA 06/18/2021, 11:21 AM

## 2021-07-09 ENCOUNTER — Other Ambulatory Visit: Payer: Self-pay

## 2021-07-09 ENCOUNTER — Ambulatory Visit (INDEPENDENT_AMBULATORY_CARE_PROVIDER_SITE_OTHER): Payer: No Payment, Other | Admitting: Licensed Clinical Social Worker

## 2021-07-09 DIAGNOSIS — F418 Other specified anxiety disorders: Secondary | ICD-10-CM

## 2021-07-09 NOTE — Progress Notes (Signed)
   THERAPIST PROGRESS NOTE  Virtual Visit via Video Note  I connected with Shelley Love on 07/09/21 at  1:00 PM EDT by a video enabled telemedicine application and verified that I am speaking with the correct person using two identifiers.  Location: Patient: Home Provider: [facility]   I discussed the limitations of evaluation and management by telemedicine and the availability of in person appointments. The patient expressed understanding and agreed to proceed. I discussed the assessment and treatment plan with the patient. The patient was provided an opportunity to ask questions and all were answered. The patient agreed with the plan and demonstrated an understanding of the instructions.  I provided 40 minutes of non-face-to-face time during this encounter.  Participation Level: Active  Behavioral Response: CasualAlertAnxious and Depressed  Type of Therapy: Individual Therapy  Treatment Goals addressed: Communication: dep/anx/coping  Interventions: CBT, Solution Focused, Supportive, and Reframing  Summary: Shelley Love is a 35 y.o. female who presents with hx of dep/anx. Pt last seen 04/30/21. LCSW assessed for significant changes. Pt reports she got evicted again but was again able to come with the funds needed to avoid moving. Pt states she started another new job ~3wks ago. Pt worked only one day at a job prior to this one. Pt currently working at [employer]. She states "It's a lot". She provides numerous details. She reports "not really" when asked if she is satisfied with work. She states she is working 8-10 hrs per day Mon-Fri and they keep training her for new tasks. Pt states she feels overwhelmed. When asked she advises she is getting positive feedback from supervisor. LCSW assisted pt to process thoughts/feelings and normalize the stress of starting a new job. Reviewed self talk and realistic expectations noting she has a recent pattern of not staying with a job long  enough to get to the more comfortable phase. Reviewed benefits of this position. Pt's biggest stressor is finances and how this position could help her get on track and get ahead. Pt agrees. Pt states a friend helped her get this job and she feels more committed d/t friend's assistance. Pt is back on MH meds and advises she can note a big difference being back on meds. She agrees to prioritize med management. When assessed for status of relationship with mother. Pt states mother is still living with her. She states discord remains in relationship and mother quit her job again. She continues to struggle to set boundaries with mother. Pt states she did sign up for completion of GED and is pleased about this development. LCSW provided encouragement and reviewed coping strategies. LCSW reviewed poc including scheduling prior to close of session. Pt states appreciation for care.    Suicidal/Homicidal: Nowithout intent/plan  Therapist Response: Pt remains receptive to care.  Plan: Return again for next avail appt.  Diagnosis: Axis I:  Depression with Anxiety     [place], LCSW 07/09/2021

## 2021-07-10 ENCOUNTER — Telehealth (HOSPITAL_COMMUNITY): Payer: Self-pay | Admitting: Licensed Clinical Social Worker

## 2021-07-10 ENCOUNTER — Encounter (HOSPITAL_COMMUNITY): Payer: Self-pay | Admitting: Licensed Clinical Social Worker

## 2021-07-10 NOTE — Telephone Encounter (Signed)
Pt requesting letter for work for missing work on appointment 07/09/21. Printed letter. Emailed to per pt request.

## 2021-07-31 ENCOUNTER — Other Ambulatory Visit: Payer: Self-pay

## 2021-08-16 ENCOUNTER — Encounter (HOSPITAL_COMMUNITY): Payer: Self-pay | Admitting: Physician Assistant

## 2021-08-19 ENCOUNTER — Other Ambulatory Visit: Payer: Self-pay

## 2021-08-19 ENCOUNTER — Telehealth (INDEPENDENT_AMBULATORY_CARE_PROVIDER_SITE_OTHER): Payer: No Payment, Other | Admitting: Physician Assistant

## 2021-08-19 DIAGNOSIS — F339 Major depressive disorder, recurrent, unspecified: Secondary | ICD-10-CM

## 2021-08-20 ENCOUNTER — Encounter (HOSPITAL_COMMUNITY): Payer: Self-pay | Admitting: Physician Assistant

## 2021-08-20 MED ORDER — HYDROXYZINE HCL 10 MG PO TABS
ORAL_TABLET | ORAL | 1 refills | Status: DC
  Filled 2021-08-20: qty 60, fill #0

## 2021-08-20 MED ORDER — SERTRALINE HCL 50 MG PO TABS
150.0000 mg | ORAL_TABLET | Freq: Every day | ORAL | 1 refills | Status: DC
  Filled 2021-08-20 – 2021-09-14 (×2): qty 90, 30d supply, fill #0
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
## 2021-08-21 ENCOUNTER — Other Ambulatory Visit: Payer: Self-pay

## 2021-08-28 ENCOUNTER — Other Ambulatory Visit: Payer: Self-pay

## 2021-09-02 ENCOUNTER — Ambulatory Visit (HOSPITAL_COMMUNITY): Payer: No Payment, Other | Admitting: Licensed Clinical Social Worker

## 2021-09-07 ENCOUNTER — Telehealth (HOSPITAL_COMMUNITY): Payer: Self-pay | Admitting: Physician Assistant

## 2021-09-07 NOTE — Telephone Encounter (Signed)
Patient request work note for 08/25/2021. Patient reports having a mental health crisis that day and not going to work.  Last saw provider 08/19/2021.  Please advise.

## 2021-09-10 NOTE — Telephone Encounter (Signed)
Provider was able to reach out to patient to discuss providing a letter for her mental health crisis that occurred back in September.

## 2021-09-14 ENCOUNTER — Telehealth (HOSPITAL_COMMUNITY): Payer: Self-pay | Admitting: Physician Assistant

## 2021-09-14 ENCOUNTER — Other Ambulatory Visit: Payer: Self-pay

## 2021-09-14 NOTE — Telephone Encounter (Signed)
Patient contacted office to follow-up on status of requested letter excusing her from work on 9/20. Patient made aware that provider is out of office and will return tomorrow. Writer will call to follow-up regarding letter when response from provider received. Patient requested letter be sent via e-mail.

## 2021-09-16 ENCOUNTER — Encounter (HOSPITAL_COMMUNITY): Payer: Self-pay | Admitting: Physician Assistant

## 2021-09-16 ENCOUNTER — Other Ambulatory Visit: Payer: Self-pay

## 2021-09-16 NOTE — Telephone Encounter (Signed)
Provider was contacted by [facility] regarding patient's request  for letter of excuse. Patient's letter to be emailed to her e-mail address that is on file.

## 2021-10-20 ENCOUNTER — Ambulatory Visit (INDEPENDENT_AMBULATORY_CARE_PROVIDER_SITE_OTHER): Payer: No Payment, Other | Admitting: Licensed Clinical Social Worker

## 2021-10-20 DIAGNOSIS — F339 Major depressive disorder, recurrent, unspecified: Secondary | ICD-10-CM

## 2021-10-21 ENCOUNTER — Telehealth (INDEPENDENT_AMBULATORY_CARE_PROVIDER_SITE_OTHER): Payer: No Payment, Other | Admitting: Physician Assistant

## 2021-10-21 DIAGNOSIS — F339 Major depressive disorder, recurrent, unspecified: Secondary | ICD-10-CM

## 2021-10-21 NOTE — Progress Notes (Deleted)
BH MD/PA/NP OP Progress Note  Virtual Visit via Telephone Note  I connected with Shelley Love on 10/23/21 at  4:00 PM EST by telephone and verified that I am speaking with the correct person using two identifiers.  Location: Patient: Home Provider: Clinic   I discussed the limitations, risks, security and privacy concerns of performing an evaluation and management service by telephone and the availability of in person appointments. I also discussed with the patient that there may be a patient responsible charge related to this service. The patient expressed understanding and agreed to proceed.  Follow Up Instructions:   I discussed the assessment and treatment plan with the patient. The patient was provided an opportunity to ask questions and all were answered. The patient agreed with the plan and demonstrated an understanding of the instructions.   The patient was advised to call back or seek an in-person evaluation if the symptoms worsen or if the condition fails to improve as anticipated.  I provided 26 minutes of non-face-to-face time during this encounter.  Meta Hatchet, PA   10/21/2021 4:13 PM Shelley Love  MRN:  [id number]  Chief Complaint: Follow up and medication management  HPI:   Shelley Love  Visit Diagnosis:    ICD-10-CM   1. Major depressive disorder, recurrent episode with anxious distress (HCC)  F33.9 sertraline (ZOLOFT) 50 MG tablet    hydrOXYzine (ATARAX/VISTARIL) 10 MG tablet      Past Psychiatric History:  Major depressive disorder Anxiety  Past Medical History: History reviewed. No pertinent past medical history. History reviewed. No pertinent surgical history.  Family Psychiatric History:  Brother- substance use disorder  Family History: History reviewed. No pertinent family history.  Social History:  Social History   Socioeconomic History   Marital status: Single    Spouse name: Not on file   Number of children: Not on file   Years  of education: Not on file   Highest education level: Not on file  Occupational History   Not on file  Tobacco Use   Smoking status: Former    Types: Cigarettes   Smokeless tobacco: Never   Tobacco comments:    occasionally  Vaping Use   Vaping Use: Former  Substance and Sexual Activity   Alcohol use: Yes    Comment: occasionally   Drug use: Yes    Types: Marijuana    Comment: occasionally   Sexual activity: Yes  Other Topics Concern   Not on file  Social History Narrative   Not on file   Social Determinants of Health   Financial Resource Strain: Not on file  Food Insecurity: Not on file  Transportation Needs: Not on file  Physical Activity: Not on file  Stress: Not on file  Social Connections: Not on file    Allergies: No Known Allergies  Metabolic Disorder Labs: No results found for: HGBA1C, MPG No results found for: PROLACTIN No results found for: CHOL, TRIG, HDL, CHOLHDL, VLDL, LDLCALC No results found for: TSH  Therapeutic Level Labs: No results found for: LITHIUM No results found for: VALPROATE No components found for:  CBMZ  Current Medications: Current Outpatient Medications  Medication Sig Dispense Refill   aspirin-acetaminophen-caffeine (EXCEDRIN MIGRAINE) 250-250-65 MG per tablet Take 2 tablets by mouth daily as needed for headache.     hydrOXYzine (ATARAX/VISTARIL) 10 MG tablet TAKE 1 TABLET (10 MG TOTAL) BY MOUTH 2 (TWO) TIMES DAILY AS NEEDED FOR ANXIETY. 60 tablet 1   ibuprofen (ADVIL) 200 MG tablet Take 400  mg by mouth every 6 (six) hours as needed for moderate pain.     methocarbamol (ROBAXIN) 500 MG tablet Take 1 tablet (500 mg total) by mouth 2 (two) times daily. (Patient not taking: Reported on 07/18/2020) 20 tablet 0   sertraline (ZOLOFT) 50 MG tablet Take 3 tablets (150 mg total) by mouth daily. 90 tablet 1   No current facility-administered medications for this visit.     Musculoskeletal: Strength & Muscle Tone: Unable to assess due to  telemedicine visit Gait & Station: Unable to assess due to telemedicine visit Patient leans: Unable to assess due to telemedicine visit  Psychiatric Specialty Exam: Review of Systems  Psychiatric/Behavioral:  Negative for decreased concentration, dysphoric mood, hallucinations, self-injury, sleep disturbance and suicidal ideas. The patient is nervous/anxious. The patient is not hyperactive.    There were no vitals taken for this visit.There is no height or weight on file to calculate BMI.  General Appearance: Well groomed  Eye Contact:  Good  Speech:  Clear and Coherent and Normal Rate  Volume:  Normal  Mood:  Anxious and Euthymic  Affect:  Appropriate and Congruent  Thought Process:  Coherent, Goal Directed, and Descriptions of Associations: Intact  Orientation:  Full (Time, Place, and Person)  Thought Content: WDL   Suicidal Thoughts:  No  Homicidal Thoughts:  No  Memory:  Immediate;   Good Recent;   Good Remote;   Fair  Judgement:  Fair  Insight:  Fair  Psychomotor Activity:  Normal  Concentration:  Concentration: Good and Attention Span: Good  Recall:  Good  Fund of Knowledge: Good  Language: Good  Akathisia:  NA  Handed:  Right  AIMS (if indicated): not done  Assets:  Communication Skills Desire for Improvement Financial Resources/Insurance Housing  ADL's:  Intact  Cognition: WNL  Sleep:  Good   Screenings: GAD-7    Flowsheet Row Video Visit from 10/21/2021 in [facility] Video Visit from 08/19/2021 in [facility] Video Visit from 06/18/2021 in [facility]  Total GAD-7 Score 19 20 20       PHQ2-9    Flowsheet Row Video Visit from 10/21/2021 in [facility] Video Visit from 08/19/2021 in [facility] Video Visit from 06/18/2021 in [facility]  PHQ-2 Total Score 2 6 2   PHQ-9 Total Score 16 23 12        Flowsheet Row Video Visit from 10/21/2021 in [facility] Video Visit from 08/19/2021 in [facility] Video Visit from 06/18/2021 in [facility]  C-SSRS RISK CATEGORY Low Risk High Risk Low Risk        Assessment and Plan:     1. Major depressive disorder, recurrent episode with anxious distress (HCC)  - sertraline (ZOLOFT) 50 MG tablet; Take 3 tablets (150 mg total) by mouth daily.  Dispense: 90 tablet; Refill: 1 - hydrOXYzine (ATARAX/VISTARIL) 10 MG tablet; TAKE 1 TABLET (10 MG TOTAL) BY MOUTH 2 (TWO) TIMES DAILY AS NEEDED FOR ANXIETY.  Dispense: 60 tablet; Refill: 1  Patient to follow up in 2 months Provider spent a total of 26 minutes with the patient/reviewing patient's chart  [facility], PA 10/21/2021, 4:13 PM

## 2021-10-22 ENCOUNTER — Encounter (HOSPITAL_COMMUNITY): Payer: Self-pay | Admitting: Physician Assistant

## 2021-10-22 MED ORDER — SERTRALINE HCL 50 MG PO TABS
150.0000 mg | ORAL_TABLET | Freq: Every day | ORAL | 1 refills | Status: DC
  Filled 2021-10-22: qty 90, 30d supply, fill #0

## 2021-10-22 MED ORDER — HYDROXYZINE HCL 10 MG PO TABS
ORAL_TABLET | ORAL | 1 refills | Status: AC
  Filled 2021-10-22: qty 60, 30d supply, fill #0

## 2021-10-23 ENCOUNTER — Other Ambulatory Visit: Payer: Self-pay

## 2021-10-24 NOTE — Progress Notes (Addendum)
BH MD/PA/NP OP Progress Note  Virtual Visit via Telephone Note  I connected with Shelley Love on 10/24/21 at  4:00 PM EST by telephone and verified that I am speaking with the correct person using two identifiers.  Location: Patient: Home Provider: Clinic   I discussed the limitations, risks, security and privacy concerns of performing an evaluation and management service by telephone and the availability of in person appointments. I also discussed with the patient that there may be a patient responsible charge related to this service. The patient expressed understanding and agreed to proceed.  Follow Up Instructions:   I discussed the assessment and treatment plan with the patient. The patient was provided an opportunity to ask questions and all were answered. The patient agreed with the plan and demonstrated an understanding of the instructions.   The patient was advised to call back or seek an in-person evaluation if the symptoms worsen or if the condition fails to improve as anticipated.  I provided 26 minutes of non-face-to-face time during this encounter.  Shelley Hatchet, PA   10/21/2021 4:13 PM Shelley Love  MRN:  [id number]  Chief Complaint: Follow up and medication management  HPI:   Shelley Love  is a 35 year old female with a past psychiatric history significant for major depressive disorder who presents to [facility] via virtual telephone visit for follow-up and medication management.  Patient is currently being managed on the following medications:  Sertraline 150 mg daily Hydroxyzine 10 mg 3 times daily as needed  Patient reports that she is doing pretty well.  Patient reports no issues or concerns regarding her current medication regimen.  Patient denies the need for dosage adjustments at this time and is requesting refills on all her medications following the conclusion of the encounter.  Patient states that many of the  stressors in her life has moved over as of late.  She feels that her depression has subsided and that she feels very mellow.  She still endorses anxiety on occasion accompanied by anxiety takes such as restless leg.  Patient rates her anxiety an 8 out of 10.  Patient denies any new stressors at this time.  Patient does report that she has been experiencing some dissociation she attributes to her sertraline use.  On occasion, she says that she will zone out making her feel like the hours are slipping away.  Patient would like to continue taking the medication because she finds it more helpful than detrimental.  As for new developments in her life, patient states that she quit her other job and has been able to pay the important bills in her life.  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  A PHQ-9 screen was performed with the patient scoring a 16.  A GAD-7 screen was also performed with the patient scoring a 19.  Patient is alert and oriented x4, pleasant, calm, cooperative, and fully engaged in conversation during the encounter.  Patient endorses good mood.  Patient denies suicidal or homicidal ideations.  She further denies auditory or visual hallucinations and does not appear to be responding to internal/external stimuli.  Patient endorses good sleep and receives on average 6 to 7 hours of sleep each night.  Patient endorses decreased appetite and eats on average 1 meal per day.  Patient endorses alcohol use and states that she drinks a moderate amount.  Patient denies tobacco use.  Patient  endorses illicit drug use in the form of marijuana.  Visit Diagnosis:    ICD-10-CM   1. Major depressive disorder, recurrent episode with anxious distress (HCC)  F33.9 sertraline (ZOLOFT) 50 MG tablet    hydrOXYzine (ATARAX/VISTARIL) 10 MG tablet      Past Psychiatric History:  Major depressive disorder Anxiety  Past Medical History: History reviewed.  No pertinent past medical history. History reviewed. No pertinent surgical history.  Family Psychiatric History:  Brother- substance use disorder  Family History: History reviewed. No pertinent family history.  Social History:  Social History   Socioeconomic History   Marital status: Single    Spouse name: Not on file   Number of children: Not on file   Years of education: Not on file   Highest education level: Not on file  Occupational History   Not on file  Tobacco Use   Smoking status: Former    Types: Cigarettes   Smokeless tobacco: Never   Tobacco comments:    occasionally  Vaping Use   Vaping Use: Former  Substance and Sexual Activity   Alcohol use: Yes    Comment: occasionally   Drug use: Yes    Types: Marijuana    Comment: occasionally   Sexual activity: Yes  Other Topics Concern   Not on file  Social History Narrative   Not on file   Social Determinants of Health   Financial Resource Strain: Not on file  Food Insecurity: Not on file  Transportation Needs: Not on file  Physical Activity: Not on file  Stress: Not on file  Social Connections: Not on file    Allergies: No Known Allergies  Metabolic Disorder Labs: No results found for: HGBA1C, MPG No results found for: PROLACTIN No results found for: CHOL, TRIG, HDL, CHOLHDL, VLDL, LDLCALC No results found for: TSH  Therapeutic Level Labs: No results found for: LITHIUM No results found for: VALPROATE No components found for:  CBMZ  Current Medications: Current Outpatient Medications  Medication Sig Dispense Refill   aspirin-acetaminophen-caffeine (EXCEDRIN MIGRAINE) 250-250-65 MG per tablet Take 2 tablets by mouth daily as needed for headache.     hydrOXYzine (ATARAX/VISTARIL) 10 MG tablet TAKE 1 TABLET (10 MG TOTAL) BY MOUTH 2 (TWO) TIMES DAILY AS NEEDED FOR ANXIETY. 60 tablet 1   ibuprofen (ADVIL) 200 MG tablet Take 400 mg by mouth every 6 (six) hours as needed for moderate pain.      methocarbamol (ROBAXIN) 500 MG tablet Take 1 tablet (500 mg total) by mouth 2 (two) times daily. (Patient not taking: Reported on 07/18/2020) 20 tablet 0   sertraline (ZOLOFT) 50 MG tablet Take 3 tablets (150 mg total) by mouth daily. 90 tablet 1   No current facility-administered medications for this visit.     Musculoskeletal: Strength & Muscle Tone: Unable to assess due to telemedicine visit Gait & Station: Unable to assess due to telemedicine visit Patient leans: Unable to assess due to telemedicine visit  Psychiatric Specialty Exam: Review of Systems  Psychiatric/Behavioral:  Negative for decreased concentration, dysphoric mood, hallucinations, self-injury, sleep disturbance and suicidal ideas. The patient is nervous/anxious. The patient is not hyperactive.    There were no vitals taken for this visit.There is no height or weight on file to calculate BMI.  General Appearance: Well groomed  Eye Contact:  Good  Speech:  Clear and Coherent and Normal Rate  Volume:  Normal  Mood:  Anxious and Euthymic  Affect:  Appropriate and Congruent  Thought Process:  Coherent,  Goal Directed, and Descriptions of Associations: Intact  Orientation:  Full (Time, Place, and Person)  Thought Content: WDL   Suicidal Thoughts:  No  Homicidal Thoughts:  No  Memory:  Immediate;   Good Recent;   Good Remote;   Fair  Judgement:  Fair  Insight:  Fair  Psychomotor Activity:  Normal  Concentration:  Concentration: Good and Attention Span: Good  Recall:  Good  Fund of Knowledge: Good  Language: Good  Akathisia:  NA  Handed:  Right  AIMS (if indicated): not done  Assets:  Communication Skills Desire for Improvement Financial Resources/Insurance Housing  ADL's:  Intact  Cognition: WNL  Sleep:  Good   Screenings: GAD-7    Flowsheet Row Video Visit from 10/21/2021 in [facility] Video Visit from 08/19/2021 in [facility] Video Visit from  06/18/2021 in [facility]  Total GAD-7 Score 19 20 20       PHQ2-9    Flowsheet Row Video Visit from 10/21/2021 in [facility] Video Visit from 08/19/2021 in [facility] Video Visit from 06/18/2021 in [facility]  PHQ-2 Total Score 2 6 2   PHQ-9 Total Score 16 23 12       Flowsheet Row Video Visit from 10/21/2021 in [facility] Video Visit from 08/19/2021 in [facility] Video Visit from 06/18/2021 in [facility]  C-SSRS RISK CATEGORY Low Risk High Risk Low Risk        Assessment and Plan:   Shelley Love  is a 35 year old female with a past psychiatric history significant for major depressive disorder who presents to [facility] via virtual telephone visit for follow-up and medication management.  Patient reports that things have been going well with the use of her medications and states that her depression has been manageable as of late.  Patient endorses some anxiety but denies the need for dosage adjustments with her medications at this time.  Patient expresses that she has been experiencing some dissociation associated with her sertraline use.  She expresses that she would still like to take her sertraline since the benefits outweigh the cost.  Patient's medications to be e-prescribed to pharmacy of choice.  1. Major depressive disorder, recurrent episode with anxious distress (HCC)  - sertraline (ZOLOFT) 50 MG tablet; Take 3 tablets (150 mg total) by mouth daily.  Dispense: 90 tablet; Refill: 1 - hydrOXYzine (ATARAX/VISTARIL) 10 MG tablet; TAKE 1 TABLET (10 MG TOTAL) BY MOUTH 2 (TWO) TIMES DAILY AS NEEDED FOR ANXIETY.  Dispense: 60 tablet; Refill: 1  Patient to follow up in 2 months Provider spent a total of 26 minutes with the patient/reviewing  patient's chart  Shelley Ranger, PA 10/21/2021, 4:13 PM

## 2021-10-26 NOTE — Progress Notes (Signed)
   THERAPIST PROGRESS NOTE   Virtual Visit via Video Note  I connected with Shelley Love on 10/20/21 at  3:00 PM EST by a video enabled telemedicine application and verified that I am speaking with the correct person using two identifiers.  Location: Patient: Home Provider: Home   I discussed the limitations of evaluation and management by telemedicine and the availability of in person appointments. The patient expressed understanding and agreed to proceed. I discussed the assessment and treatment plan with the patient. The patient was provided an opportunity to ask questions and all were answered. The patient agreed with the plan and demonstrated an understanding of the instructions.   The patient was advised to call back or seek an in-person evaluation if the symptoms worsen or if the condition fails to improve as anticipated.  I provided 45 minutes of non-face-to-face time during this encounter.  Participation Level: Active  Behavioral Response: CasualAlertNegative, Anxious, and Depressed  Type of Therapy: Individual Therapy  Treatment Goals addressed: Communication: dep/anx/coping  Interventions: Solution Focused, Supportive, and Reframing  Summary: Shelley Love is a 35 y.o. female who presents with hx of dep/anx.  Today patient logs for virtual session.  Last completed session was August 4 of this year.  LCSW assessed for overall changes since last session.  Patient reports her brother and 2 nieces are once again living with her.  Brother is reportedly 88 years of age, nieces are ages 61 and 55.  Patient states that her brother, to her knowledge, is not using substances as before and he has been working as of 3 weeks ago.  Patient reports he is contributing to the finances of the household.  Mother remains in the house.  Patient reports they are getting along better, mother is working and also contributing to finances in the household.  Patient reports at the present time all bills  are completely caught up which is a relief.  Patient reports she is no longer working at [employer] as of October 26.  She speaks at length with great negativity about her experience there and ultimately walked off the job.  Patient reports she will be starting a new job this week.  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  Patient reports she knows someone who works there and this is how she secured the position.  Patient will be working full-time, she is not sure which shift she will work yet.  Patient reports she will be eligible for insurance but she does not know the timing of this.  She states she will not have to interact with residents, she will only be cooking and cleaning up the kitchen.  Shelley Love believes this will be a low stress job and is looking forward to being part of a mostly female team.  Patient states she is taking meds as prescribed and did have an adjustment she feels has been beneficial for her.  Patient aware that she has another medication management appointment tomorrow. Pt denies other changes/worries or concerns. LCSW reviewed poc including scheduling prior to close of session. Pt states appreciation for care.   Suicidal/Homicidal: Nowithout intent/plan  Therapist Response: Pt receptive to care.  Plan: Return again for next avail appt. Microwave s'mores since too cold to be outside with nieces.  Diagnosis: Axis I:  MDD, with anxious distress   [place], LCSW 10/26/2021

## 2021-10-28 ENCOUNTER — Other Ambulatory Visit: Payer: Self-pay

## 2021-11-03 ENCOUNTER — Other Ambulatory Visit: Payer: Self-pay

## 2021-12-23 ENCOUNTER — Telehealth (HOSPITAL_COMMUNITY): Payer: No Payment, Other | Admitting: Physician Assistant

## 2021-12-23 ENCOUNTER — Encounter (HOSPITAL_COMMUNITY): Payer: Self-pay

## 2022-01-04 ENCOUNTER — Encounter (HOSPITAL_COMMUNITY): Payer: Self-pay

## 2022-01-04 ENCOUNTER — Ambulatory Visit (HOSPITAL_COMMUNITY): Payer: No Payment, Other | Admitting: Licensed Clinical Social Worker

## 2022-02-01 ENCOUNTER — Encounter (HOSPITAL_COMMUNITY): Payer: Self-pay

## 2022-02-01 ENCOUNTER — Telehealth (HOSPITAL_COMMUNITY): Payer: Self-pay | Admitting: Licensed Clinical Social Worker

## 2022-02-01 ENCOUNTER — Ambulatory Visit (HOSPITAL_COMMUNITY): Payer: No Payment, Other | Admitting: Licensed Clinical Social Worker

## 2022-02-01 NOTE — Telephone Encounter (Signed)
LCSW sent text message with link for video session per schedule. LCSW remained available online for session until 4:10pm. Pt failed to sign on for session.

## 2022-02-02 ENCOUNTER — Telehealth (HOSPITAL_COMMUNITY): Payer: No Payment, Other | Admitting: Physician Assistant

## 2022-02-02 ENCOUNTER — Encounter (HOSPITAL_COMMUNITY): Payer: Self-pay

## 2022-02-02 NOTE — Progress Notes (Unsigned)
BH MD/PA/NP OP Progress Note  02/02/2022 3:41 PM Shelley Love  MRN:  [id number]  Chief Complaint: No chief complaint on file.  HPI: *** Visit Diagnosis: No diagnosis found.  Past Psychiatric History: ***  Past Medical History: No past medical history on file. No past surgical history on file.  Family Psychiatric History: ***  Family History: No family history on file.  Social History:  Social History   Socioeconomic History   Marital status: Single    Spouse name: Not on file   Number of children: Not on file   Years of education: Not on file   Highest education level: Not on file  Occupational History   Not on file  Tobacco Use   Smoking status: Former    Types: Cigarettes   Smokeless tobacco: Never   Tobacco comments:    occasionally  Vaping Use   Vaping Use: Former  Substance and Sexual Activity   Alcohol use: Yes    Comment: occasionally   Drug use: Yes    Types: Marijuana    Comment: occasionally   Sexual activity: Yes  Other Topics Concern   Not on file  Social History Narrative   Not on file   Social Determinants of Health   Financial Resource Strain: Not on file  Food Insecurity: Not on file  Transportation Needs: Not on file  Physical Activity: Not on file  Stress: Not on file  Social Connections: Not on file    Allergies: No Known Allergies  Metabolic Disorder Labs: No results found for: HGBA1C, MPG No results found for: PROLACTIN No results found for: CHOL, TRIG, HDL, CHOLHDL, VLDL, LDLCALC No results found for: TSH  Therapeutic Level Labs: No results found for: LITHIUM No results found for: VALPROATE No components found for:  CBMZ  Current Medications: Current Outpatient Medications  Medication Sig Dispense Refill   aspirin-acetaminophen-caffeine (EXCEDRIN MIGRAINE) 250-250-65 MG per tablet Take 2 tablets by mouth daily as needed for headache.     hydrOXYzine (ATARAX/VISTARIL) 10 MG tablet TAKE 1 TABLET (10 MG TOTAL) BY MOUTH 2  (TWO) TIMES DAILY AS NEEDED FOR ANXIETY. 60 tablet 1   ibuprofen (ADVIL) 200 MG tablet Take 400 mg by mouth every 6 (six) hours as needed for moderate pain.     methocarbamol (ROBAXIN) 500 MG tablet Take 1 tablet (500 mg total) by mouth 2 (two) times daily. (Patient not taking: Reported on 07/18/2020) 20 tablet 0   sertraline (ZOLOFT) 50 MG tablet Take 3 tablets (150 mg total) by mouth daily. 90 tablet 1   No current facility-administered medications for this visit.     Musculoskeletal: Strength & Muscle Tone: {desc; muscle tone:32375} Gait & Station: {PE GAIT ED APOL:41030} Patient leans: {Patient Leans:21022755}  Psychiatric Specialty Exam: Review of Systems  There were no vitals taken for this visit.There is no height or weight on file to calculate BMI.  General Appearance: {Appearance:22683}  Eye Contact:  {BHH EYE CONTACT:22684}  Speech:  {Speech:22685}  Volume:  {Volume (PAA):22686}  Mood:  {BHH MOOD:22306}  Affect:  {Affect (PAA):22687}  Thought Process:  {Thought Process (PAA):22688}  Orientation:  {BHH ORIENTATION (PAA):22689}  Thought Content: {Thought Content:22690}   Suicidal Thoughts:  {ST/HT (PAA):22692}  Homicidal Thoughts:  {ST/HT (PAA):22692}  Memory:  {BHH MEMORY:22881}  Judgement:  {Judgement (PAA):22694}  Insight:  {Insight (PAA):22695}  Psychomotor Activity:  {Psychomotor (PAA):22696}  Concentration:  {Concentration:21399}  Recall:  {BHH GOOD/FAIR/POOR:22877}  Fund of Knowledge: {BHH GOOD/FAIR/POOR:22877}  Language: {BHH GOOD/FAIR/POOR:22877}  Akathisia:  {BHH YES  OR NO:22294}  Handed:  {Handed:22697}  AIMS (if indicated): {Desc; done/not:10129}  Assets:  {Assets (PAA):22698}  ADL's:  {BHH TW:9249394  Cognition: {chl bhh cognition:304700322}  Sleep:  {BHH GOOD/FAIR/POOR:22877}   Screenings: GAD-7    Flowsheet Row Video Visit from 10/21/2021 in [facility] Video Visit from 08/19/2021 in [facility] Video Visit from 06/18/2021 in [facility]  Total GAD-7 Score 19 20 20       PHQ2-9    Flowsheet Row Video Visit from 10/21/2021 in [facility] Video Visit from 08/19/2021 in [facility] Video Visit from 06/18/2021 in [facility]  PHQ-2 Total Score 2 6 2   PHQ-9 Total Score 16 23 12       Flowsheet Row Video Visit from 10/21/2021 in [facility] Video Visit from 08/19/2021 in [place] Video Visit from 06/18/2021 in [place] CATEGORY Low Risk High Risk Low Risk        Assessment and Plan: ***  Collaboration of Care: Collaboration of Care: [facility] OP Collaboration of [id number]  Patient/Guardian was advised Release of Information must be obtained prior to any record release in order to collaborate their care with an outside provider. Patient/Guardian was advised if they have not already done so to contact the registration department to sign all necessary forms in order for [place] to release information regarding their care.   Consent: Patient/Guardian gives verbal consent for treatment and assignment of benefits for services provided during this visit. Patient/Guardian expressed understanding and agreed to proceed.    Malachy Mood, PA 02/02/2022, 3:41 PM

## 2022-03-01 ENCOUNTER — Ambulatory Visit (INDEPENDENT_AMBULATORY_CARE_PROVIDER_SITE_OTHER): Payer: No Payment, Other | Admitting: Licensed Clinical Social Worker

## 2022-03-01 DIAGNOSIS — F339 Major depressive disorder, recurrent, unspecified: Secondary | ICD-10-CM

## 2022-03-02 ENCOUNTER — Telehealth (HOSPITAL_COMMUNITY): Payer: Self-pay | Admitting: Licensed Clinical Social Worker

## 2022-03-02 NOTE — Telephone Encounter (Signed)
Cln updated pt's phone number to [phone], per Marybelle Killings, LCSW. Pt oriented to [facility] and scheduled for CCA on 3/29. Pt provided with office number to call to cancel or reschedule [phone]) and informed that staff are sometimes remote and may call from a private phone number. Pt verbalized understanding and denied SI. ?

## 2022-03-02 NOTE — Progress Notes (Signed)
? ?  THERAPIST PROGRESS NOTE ? ? ?Virtual Visit via Video Note ? ?I connected with Shelley Love on 03/01/22 at  4:00 PM EDT by a video enabled telemedicine application and verified that I am speaking with the correct person using two identifiers. ? ?Location: ?Patient: Home ?Provider: [facility] ?  ?I discussed the limitations of evaluation and management by telemedicine and the availability of in person appointments. The patient expressed understanding and agreed to proceed. ?I discussed the assessment and treatment plan with the patient. The patient was provided an opportunity to ask questions and all were answered. The patient agreed with the plan and demonstrated an understanding of the instructions. ?  ?The patient was advised to call back or seek an in-person evaluation if the symptoms worsen or if the condition fails to improve as anticipated. ? ?I provided 42 minutes of non-face-to-face time during this encounter. ? ?Participation Level: Active ? ?Behavioral Response: CasualAlertDepressed and Dysphoric ? ?Type of Therapy: Individual Therapy ? ?Treatment Goals addressed: dep/anx/stressors/coping ? ?ProgressTowards Goals: Not Progressing ? ?Interventions: Solution Focused and Supportive ? ?Summary: Shelley Love is a 36 y.o. female who presents with hx of dep/anx.  Today patient logs on for video session per schedule.  Patient last seen November of last year.  This date another counselor, Shelley Love, is included in session with patient's permission.  LCSW assessed for patient's overall status and any significant changes.  Shelley Love advises she is currently living alone.  She reports her mother moved out and moved to [place].  She also advises there were once again problems with her brother and he has moved out as well.  Shelley Love is currently unemployed and reports she has been unemployed since January.  She continues to have significant financial problems.  She is eating poorly and sleeping poorly.  She reports a  significant weight loss.  Shelley Love states she has not been on any mental health medications since probably October of last year.  She is very tearful most of session and agrees she is in a very bad place at the present time. Shelley Love denies suicidal ideation. LCSW brings up PHP which patient is willing to consider and she is willing to go back on MH meds.  She agrees for Shelley Love to contact her tomorrow to discuss PHP further.  LCSW advised Shelley Love of this clinician's resignation from [facility] behavioral health.  Informed her that Shelley Love will be her new counselor.  Assisted patient to process thoughts, feelings related to her current situation.   She agrees to get back on track with routine counseling and medication going forward. ? ?Suicidal/Homicidal: Nowithout intent/plan ? ?Therapist Response: Pt somewhat receptive to care. ? ?Plan: Return again for next avail appt with new counselor as this clinician has resigned. ? ?Diagnosis: No diagnosis found. ? ?Collaboration of Care: Other PHP referral ? ?Patient/Guardian was advised Release of Information must be obtained prior to any record release in order to collaborate their care with an outside provider. Patient/Guardian was advised if they have not already done so to contact the registration department to sign all necessary forms in order for [place] to release information regarding their care.  ? ?Consent: Patient/Guardian gives verbal consent for treatment and assignment of benefits for services provided during this visit. Patient/Guardian expressed understanding and agreed to proceed.  ? ?[place] Sink, LCSW ?03/02/2022 ? ?

## 2022-03-03 ENCOUNTER — Ambulatory Visit (HOSPITAL_COMMUNITY): Payer: No Payment, Other | Admitting: Licensed Clinical Social Worker

## 2022-03-03 DIAGNOSIS — F339 Major depressive disorder, recurrent, unspecified: Secondary | ICD-10-CM

## 2022-03-03 NOTE — Plan of Care (Signed)
?  Problem: Depression CCP Problem  1 Learn and Apply Coping Skills to Decrease Depressive Symptoms   ?Goal: STG: Shelley Love WILL ATTEND AT LEAST 80% OF SCHEDULED PHP SESSIONS ?Outcome: Not Applicable ?Goal: STG: Shelley Love WILL ATTEND AT LEAST 80% OF SCHEDULED GROUP PSYCHOTHERAPY SESSIONS ?Outcome: Not Applicable ?Goal: STG: Shelley Love WILL COMPLETE AT LEAST 80% OF ASSIGNED HOMEWORK ?Outcome: Not Applicable ?Goal: STG: Shelley Love WILL IDENTIFY AT LEAST 3 COGNITIVE PATTERNS AND BELIEFS THAT SUPPORT DEPRESSION ?Outcome: Not Applicable ? ?Pt agrees to tx plan ?  ?
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
## 2022-03-04 ENCOUNTER — Telehealth (HOSPITAL_COMMUNITY): Payer: Self-pay | Admitting: Licensed Clinical Social Worker

## 2022-03-04 ENCOUNTER — Ambulatory Visit (HOSPITAL_COMMUNITY): Payer: No Payment, Other
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
## 2022-03-05 ENCOUNTER — Ambulatory Visit (HOSPITAL_COMMUNITY): Payer: No Payment, Other

## 2022-03-08 ENCOUNTER — Ambulatory Visit (HOSPITAL_COMMUNITY): Payer: No Payment, Other

## 2022-03-09 ENCOUNTER — Ambulatory Visit (HOSPITAL_COMMUNITY): Payer: No Payment, Other

## 2022-03-10 ENCOUNTER — Ambulatory Visit (HOSPITAL_COMMUNITY): Payer: No Payment, Other

## 2022-03-11 ENCOUNTER — Ambulatory Visit (HOSPITAL_COMMUNITY): Payer: No Payment, Other

## 2022-03-12 ENCOUNTER — Ambulatory Visit (HOSPITAL_COMMUNITY): Payer: No Payment, Other

## 2022-03-15 ENCOUNTER — Ambulatory Visit (HOSPITAL_COMMUNITY): Payer: No Payment, Other

## 2022-03-16 ENCOUNTER — Ambulatory Visit (HOSPITAL_COMMUNITY): Payer: No Payment, Other

## 2022-03-17 ENCOUNTER — Ambulatory Visit (HOSPITAL_COMMUNITY): Payer: No Payment, Other

## 2022-03-18 ENCOUNTER — Ambulatory Visit (HOSPITAL_COMMUNITY): Payer: No Payment, Other

## 2022-03-30 NOTE — Telephone Encounter (Signed)
"  Good morning. I don't know if you remember me. We spoke a few weeks back. I just seen where I missed the deadline for my appointment.  Believe I created an account.  I have not been able to get my insurance set up yet, but have the paperwork somewhere. I am reaching out because I feel myself slipping. I need help. I am not eating properly,  sleeping right, and I feel like I'm drowning. I don't know what to do." ?

## 2022-04-28 ENCOUNTER — Ambulatory Visit (HOSPITAL_COMMUNITY): Payer: No Payment, Other | Admitting: Licensed Clinical Social Worker

## 2022-04-28 ENCOUNTER — Encounter (HOSPITAL_COMMUNITY): Payer: Self-pay

## 2022-04-28 NOTE — Progress Notes (Addendum)
Cln signed on at 4:00 pm, sent text link [phone]) for video session per schedule, then an additional link at 4:05 pm, and remained online for session until 4:10 pm. Pt failed to sign on for session.

## 2022-07-28 ENCOUNTER — Encounter: Payer: Self-pay | Admitting: Family Medicine

## 2022-08-10 ENCOUNTER — Encounter: Payer: Self-pay | Admitting: General Practice

## 2022-10-07 ENCOUNTER — Other Ambulatory Visit (HOSPITAL_COMMUNITY)
Admission: RE | Admit: 2022-10-07 | Discharge: 2022-10-07 | Disposition: A | Discharge status: Home / Self Care | Payer: Medicaid Other | Source: Ambulatory Visit | Attending: Family Medicine | Admitting: Family Medicine

## 2022-10-07 ENCOUNTER — Ambulatory Visit (INDEPENDENT_AMBULATORY_CARE_PROVIDER_SITE_OTHER): Payer: Medicaid Other | Admitting: Family Medicine

## 2022-10-07 ENCOUNTER — Encounter: Payer: Self-pay | Admitting: Family Medicine

## 2022-10-07 VITALS — BP 114/69 | HR 76 | Ht 65.0 in | Wt 149.8 lb

## 2022-10-07 DIAGNOSIS — Z01419 Encounter for gynecological examination (general) (routine) without abnormal findings: Secondary | ICD-10-CM | POA: Diagnosis present

## 2022-10-07 DIAGNOSIS — Z0000 Encounter for general adult medical examination without abnormal findings: Secondary | ICD-10-CM | POA: Diagnosis not present

## 2022-10-07 NOTE — Progress Notes (Signed)
Unsure of last pap smear  

## 2022-10-07 NOTE — Progress Notes (Signed)
ANNUAL EXAM Patient name: Shelley Love MRN [id number]  Date of birth: 01-22-1986 Chief Complaint:   Annual Exam  History of Present Illness:   Shelley Love is a 36 y.o.  G0P0000  female  being seen today for a routine annual exam.  Current complaints: none Normal interval between menses: 28-30 days. Menses length: 4 days. Has boyfriend - uses condoms for birthcontrol.  Patient's last menstrual period was 09/29/2022 (approximate).    Last pap uncertain. Results were:  normal . H/O abnormal pap: no Last mammogram: n/a     03/03/2022   10:55 AM 10/21/2021    4:16 PM 08/19/2021    3:51 PM 06/18/2021   11:11 AM  Depression screen PHQ 2/9  Decreased Interest      Down, Depressed, Hopeless      PHQ - 2 Score      Altered sleeping      Tired, decreased energy      Change in appetite      Feeling bad or failure about yourself       Trouble concentrating      Moving slowly or fidgety/restless      Suicidal thoughts      PHQ-9 Score      Difficult doing work/chores         Information is confidential and restricted. Go to Review Flowsheets to unlock data.        10/21/2021    4:20 PM 08/19/2021    3:55 PM 06/18/2021   11:14 AM  GAD 7 : Generalized Anxiety Score  Nervous, Anxious, on Edge     Control/stop worrying     Worry too much - different things     Trouble relaxing     Restless     Easily annoyed or irritable     Afraid - awful might happen     Total GAD 7 Score     Anxiety Difficulty        Information is confidential and restricted. Go to Review Flowsheets to unlock data.     Review of Systems:   Pertinent items are noted in HPI Denies any headaches, blurred vision, fatigue, shortness of breath, chest pain, abdominal pain, abnormal vaginal discharge/itching/odor/irritation, problems with periods, bowel movements, urination, or intercourse unless otherwise stated above. Pertinent History Reviewed:  Reviewed past medical,surgical, social and family history.   Reviewed problem list, medications and allergies. Physical Assessment:   Vitals:   10/07/22 1446  BP: 114/69  Pulse: 76  Weight: 149 lb 12.8 oz (67.9 kg)  Height: 5\' 5"  (1.651 m)  Body mass index is 24.93 kg/m.        Physical Examination:   General appearance - well appearing, and in no distress  Mental status - alert, oriented to person, place, and time  Psych:  She has a normal mood and affect  Skin - warm and dry, normal color, no suspicious lesions noted  Chest - effort normal, all lung fields clear to auscultation bilaterally  Heart - normal rate and regular rhythm  Neck:  midline trachea, no thyromegaly or nodules  Breasts - breasts appear normal, no suspicious masses, no skin or nipple changes or axillary nodes  Abdomen - soft, nontender, nondistended, no masses or organomegaly  Pelvic - VULVA: normal appearing vulva with no masses, tenderness or lesions  VAGINA: normal appearing vagina with normal color and discharge, no lesions  CERVIX: normal appearing cervix without discharge or lesions, no CMT  Thin prep pap is done with  HR HPV cotesting  UTERUS: uterus is felt to be normal size, shape, consistency and nontender   ADNEXA: No adnexal masses or tenderness noted.  Extremities:  No swelling or varicosities noted  Chaperone present for exam  Assessment & Plan:  1. Well woman exam with routine gynecological exam PAP today.   No orders of the defined types were placed in this encounter.   Meds: No orders of the defined types were placed in this encounter.   Follow-up: No follow-ups on file.  Truett Mainland, DO 10/07/2022 3:22 PM

## 2022-10-13 LAB — CYTOLOGY - PAP
Comment: NEGATIVE
Diagnosis: NEGATIVE
High risk HPV: NEGATIVE

## 2022-10-17 IMAGING — DX DG CHEST 1V
1 series · 1 of 1 positions shown · non-contrast
Comparison: None.

CLINICAL DATA: 35-year-old female with cough congestion and
shortness of breath for 1 week.

EXAM:
CHEST  1 VIEW

[chest ap]
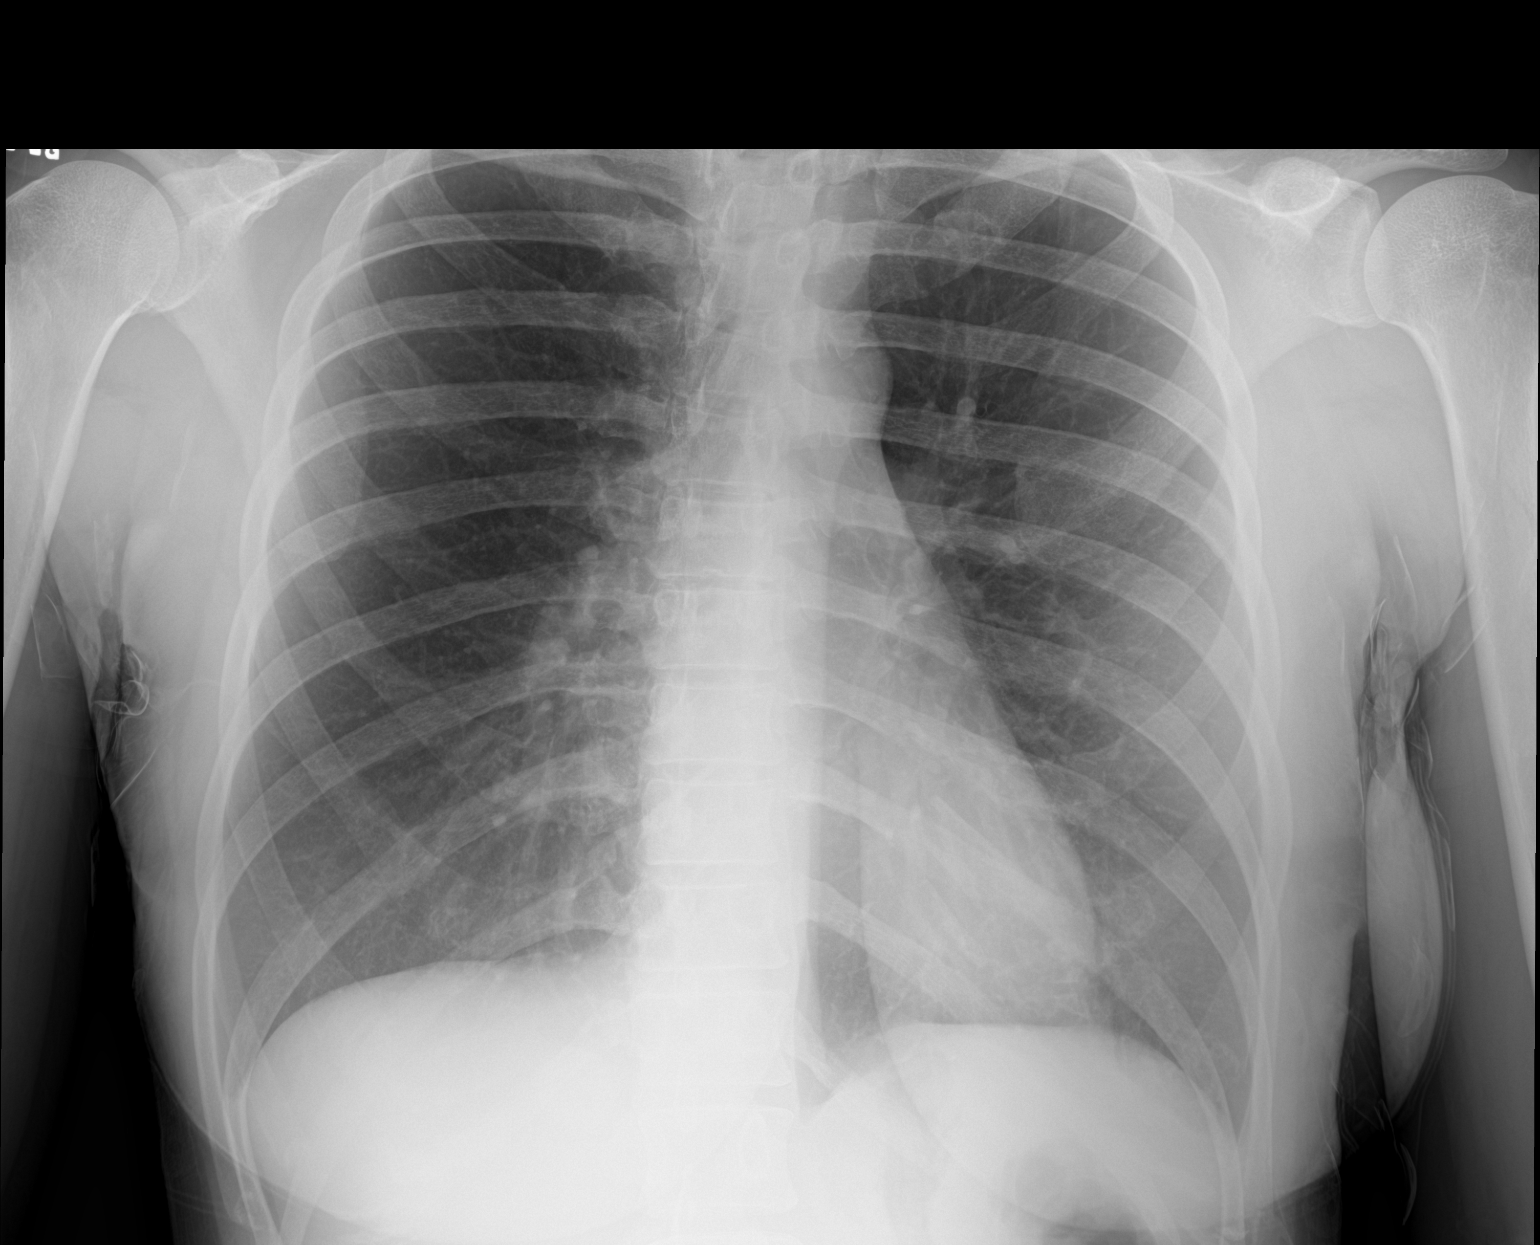

[1 of 1 positions shown; findings below may reference images not displayed]

FINDINGS: Portable AP semi upright view at 9992 hours. Lung volumes and
mediastinal contours are within normal limits, possible mild pectus
deformity. Visualized tracheal air column is within normal limits.
Allowing for portable technique the lungs are clear. No pneumothorax
or pleural effusion. No osseous abnormality identified. Negative
visible bowel gas pattern.
IMPRESSION: Negative portable chest.

## 2023-11-21 ENCOUNTER — Ambulatory Visit
Admission: EM | Admit: 2023-11-21 | Discharge: 2023-11-21 | Disposition: A | Discharge status: Home / Self Care | Payer: Self-pay | Attending: Family Medicine | Admitting: Family Medicine

## 2023-11-21 DIAGNOSIS — B349 Viral infection, unspecified: Secondary | ICD-10-CM | POA: Diagnosis not present

## 2023-11-21 LAB — POC COVID19/FLU A&B COMBO
Covid Antigen, POC: NEGATIVE
Influenza A Antigen, POC: NEGATIVE
Influenza B Antigen, POC: NEGATIVE

## 2023-11-21 MED ORDER — PROMETHAZINE-DM 6.25-15 MG/5ML PO SYRP: 5.0000 mL | ORAL_SOLUTION | Freq: Three times a day (TID) | ORAL | 0 refills | Status: AC | PRN

## 2023-11-21 MED ORDER — CETIRIZINE HCL 10 MG PO TABS: 10.0000 mg | ORAL_TABLET | Freq: Every day | ORAL | 0 refills | Status: AC

## 2023-11-21 MED ORDER — PSEUDOEPHEDRINE HCL 60 MG PO TABS: 60.0000 mg | ORAL_TABLET | Freq: Three times a day (TID) | ORAL | 0 refills | Status: AC | PRN

## 2023-11-21 MED ORDER — IBUPROFEN 600 MG PO TABS: 600.0000 mg | ORAL_TABLET | Freq: Four times a day (QID) | ORAL | 0 refills | Status: AC | PRN

## 2023-11-21 NOTE — ED Provider Notes (Signed)
[facility]  Note:  This document was prepared using Conservation officer, historic buildings and may include unintentional dictation errors.  MRN: [id number] DOB: 1986/02/15  Subjective:   Shelley Love is a 37 y.o. female presenting for 2 day history of acute onset runny nose, body aches, headaches, chest tightness. Works at [employer], had some sick co-workers. No chest pain, shob, wheezing. No asthma. No smoking of any kind including cigarettes, cigars, vaping, marijuana use.  Quit 3 months ago.   No current facility-administered medications for this encounter.  Current Outpatient Medications:    Pseudoephedrine-APAP-DM (DAYQUIL PO), Take by mouth., Disp: , Rfl:    aspirin-acetaminophen-caffeine (EXCEDRIN MIGRAINE) 250-250-65 MG per tablet, Take 2 tablets by mouth daily as needed for headache., Disp: , Rfl:    ibuprofen (ADVIL) 200 MG tablet, Take 400 mg by mouth every 6 (six) hours as needed for moderate pain., Disp: , Rfl:    methocarbamol (ROBAXIN) 500 MG tablet, Take 1 tablet (500 mg total) by mouth 2 (two) times daily. (Patient not taking: Reported on 07/18/2020), Disp: 20 tablet, Rfl: 0   sertraline (ZOLOFT) 50 MG tablet, Take 3 tablets (150 mg total) by mouth daily. (Patient not taking: Reported on 10/07/2022), Disp: 90 tablet, Rfl: 1   No Known Allergies  Past Medical History:  Diagnosis Date   Herpes simplex virus (HSV) infection    Mental disorder      History reviewed. No pertinent surgical history.  History reviewed. No pertinent family history.  Social History   Tobacco Use   Smoking status: Former    Types: Cigarettes   Smokeless tobacco: Never   Tobacco comments:    occasionally  Vaping Use   Vaping status: Some Days  Substance Use Topics   Alcohol use: Yes    Comment: Friday-Sunday   Drug use: Yes    Types: Marijuana    Comment: occasionally    ROS   Objective:   Vitals: BP 131/82 (BP Location: Right Arm)   Pulse 71   Temp 97.9 F  (36.6 C) (Oral)   Resp 16   LMP  (Within Days)   SpO2 97%   Physical Exam Constitutional:      General: She is not in acute distress.    Appearance: Normal appearance. She is well-developed and normal weight. She is not ill-appearing, toxic-appearing or diaphoretic.  HENT:     Head: Normocephalic and atraumatic.     Right Ear: Tympanic membrane, ear canal and external ear normal. No drainage or tenderness. No middle ear effusion. There is no impacted cerumen. Tympanic membrane is not erythematous or bulging.     Left Ear: Tympanic membrane, ear canal and external ear normal. No drainage or tenderness.  No middle ear effusion. There is no impacted cerumen. Tympanic membrane is not erythematous or bulging.     Nose: Congestion and rhinorrhea present.     Mouth/Throat:     Mouth: Mucous membranes are moist. No oral lesions.     Pharynx: No pharyngeal swelling, oropharyngeal exudate, posterior oropharyngeal erythema or uvula swelling.     Tonsils: No tonsillar exudate or tonsillar abscesses.  Eyes:     General: No scleral icterus.       Right eye: No discharge.        Left eye: No discharge.     Extraocular Movements: Extraocular movements intact.     Right eye: Normal extraocular motion.     Left eye: Normal extraocular motion.     Conjunctiva/sclera:  Conjunctivae normal.     Pupils: Pupils are equal, round, and reactive to light.  Cardiovascular:     Rate and Rhythm: Normal rate and regular rhythm.     Heart sounds: Normal heart sounds. No murmur heard.    No friction rub. No gallop.  Pulmonary:     Effort: Pulmonary effort is normal. No respiratory distress.     Breath sounds: No stridor. No wheezing, rhonchi or rales.  Chest:     Chest wall: No tenderness.  Musculoskeletal:     Cervical back: Normal range of motion and neck supple.  Lymphadenopathy:     Cervical: No cervical adenopathy.  Skin:    General: Skin is warm and dry.  Neurological:     General: No focal deficit  present.     Mental Status: She is alert and oriented to person, place, and time.     Cranial Nerves: No cranial nerve deficit.     Motor: No weakness.     Coordination: Coordination normal.     Gait: Gait normal.  Psychiatric:        Mood and Affect: Mood normal.        Behavior: Behavior normal.        Thought Content: Thought content normal.        Judgment: Judgment normal.    Results for orders placed or performed during the hospital encounter of 11/21/23 (from the past 24 hours)  POC Covid + Flu A/B Antigen     Status: None   Collection Time: 11/21/23  6:08 PM  Result Value Ref Range   Influenza A Antigen, POC Negative Negative   Influenza B Antigen, POC Negative Negative   Covid Antigen, POC Negative Negative     Assessment and Plan :   PDMP not reviewed this encounter.  1. Acute viral syndrome    Deferred imaging given clear cardiopulmonary exam, hemodynamically stable vital signs.  Suspect viral URI, viral syndrome. Physical exam findings reassuring and vital signs stable for discharge. Advised supportive care, offered symptomatic relief. Counseled patient on potential for adverse effects with medications prescribed/recommended today, ER and return-to-clinic precautions discussed, patient verbalized understanding.     Wallis Bamberg, [place] 11/21/23 1610

## 2023-11-21 NOTE — ED Triage Notes (Signed)
Pt reports runny nose, nausea,  lightheadedness, back pain x 2 days; headache started today.  Dayquil and ibuprofen gives some relief.

## 2023-11-21 NOTE — Discharge Instructions (Signed)
We will manage this as a viral illness. For sore throat or cough try using a honey-based tea. Use 3 teaspoons of honey with juice squeezed from half lemon. Place shaved pieces of ginger into 1/2-1 cup of water and warm over stove top. Then mix the ingredients and repeat every 4 hours as needed. Please take ibuprofen 600mg  every 6 hours with food alternating with OR taken together with Tylenol 500mg -650mg  every 6 hours for throat pain, fevers, aches and pains. Hydrate very well with at least 2 liters of water. Eat light meals such as soups (chicken and noodles, vegetable, chicken and wild rice).  Do not eat foods that you are allergic to.  Taking an antihistamine like Zyrtec (10mg  daily) can help against postnasal drainage, sinus congestion which can cause sinus pain, sinus headaches, throat pain, painful swallowing, coughing.  You can take this together with pseudoephedrine (Sudafed) at a dose of 60 mg 3 times a day or twice daily as needed for the same kind of nasal drip, congestion.  Use cough syrup as needed.

## 2024-06-06 ENCOUNTER — Encounter (HOSPITAL_COMMUNITY): Payer: Self-pay

## 2024-06-06 ENCOUNTER — Emergency Department (HOSPITAL_COMMUNITY)
Admission: EM | Admit: 2024-06-06 | Discharge: 2024-06-06 | Disposition: A | Discharge status: Home / Self Care | Payer: Self-pay

## 2024-06-06 ENCOUNTER — Other Ambulatory Visit: Payer: Self-pay

## 2024-06-06 ENCOUNTER — Emergency Department (HOSPITAL_COMMUNITY): Payer: Self-pay

## 2024-06-06 DIAGNOSIS — R0789 Other chest pain: Secondary | ICD-10-CM | POA: Insufficient documentation

## 2024-06-06 DIAGNOSIS — F410 Panic disorder [episodic paroxysmal anxiety] without agoraphobia: Secondary | ICD-10-CM | POA: Insufficient documentation

## 2024-06-06 LAB — CBC WITH DIFFERENTIAL/PLATELET
Abs Immature Granulocytes: 0.02 10*3/uL (ref 0.00–0.07)
Basophils Absolute: 0.1 10*3/uL (ref 0.0–0.1)
Basophils Relative: 1 %
Eosinophils Absolute: 0.1 10*3/uL (ref 0.0–0.5)
Eosinophils Relative: 1 %
HCT: 41.2 % (ref 36.0–46.0)
Hemoglobin: 13.1 g/dL (ref 12.0–15.0)
Immature Granulocytes: 0 %
Lymphocytes Relative: 28 %
Lymphs Abs: 2.4 10*3/uL (ref 0.7–4.0)
MCH: 28.5 pg (ref 26.0–34.0)
MCHC: 31.8 g/dL (ref 30.0–36.0)
MCV: 89.8 fL (ref 80.0–100.0)
Monocytes Absolute: 0.6 10*3/uL (ref 0.1–1.0)
Monocytes Relative: 7 %
Neutro Abs: 5.6 10*3/uL (ref 1.7–7.7)
Neutrophils Relative %: 63 %
Platelets: 280 10*3/uL (ref 150–400)
RBC: 4.59 MIL/uL (ref 3.87–5.11)
RDW: 13.6 % (ref 11.5–15.5)
WBC: 8.7 10*3/uL (ref 4.0–10.5)
nRBC: 0 % (ref 0.0–0.2)

## 2024-06-06 LAB — URINALYSIS, ROUTINE W REFLEX MICROSCOPIC
Bilirubin Urine: NEGATIVE
Glucose, UA: NEGATIVE mg/dL
Hgb urine dipstick: NEGATIVE
Ketones, ur: NEGATIVE mg/dL
Leukocytes,Ua: NEGATIVE
Nitrite: NEGATIVE
Protein, ur: NEGATIVE mg/dL
Specific Gravity, Urine: 1.019 (ref 1.005–1.030)
pH: 7 (ref 5.0–8.0)

## 2024-06-06 LAB — BASIC METABOLIC PANEL WITH GFR
Anion gap: 9 (ref 5–15)
BUN: 17 mg/dL (ref 6–20)
CO2: 25 mmol/L (ref 22–32)
Calcium: 9.4 mg/dL (ref 8.9–10.3)
Chloride: 103 mmol/L (ref 98–111)
Creatinine, Ser: 0.94 mg/dL (ref 0.44–1.00)
GFR, Estimated: >60 mL/min (ref >60)
Glucose, Bld: 99 mg/dL (ref 70–99)
Potassium: 4.1 mmol/L (ref 3.5–5.1)
Sodium: 137 mmol/L (ref 135–145)

## 2024-06-06 LAB — PREGNANCY, URINE: Preg Test, Ur: NEGATIVE

## 2024-06-06 LAB — TROPONIN I (HIGH SENSITIVITY): Troponin I (High Sensitivity): <2 ng/L (ref <18)

## 2024-06-06 MED ORDER — HYDROXYZINE HCL 25 MG PO TABS: 25.0000 mg | ORAL_TABLET | Freq: Three times a day (TID) | ORAL | 0 refills | Status: AC | PRN

## 2024-06-06 MED ORDER — SODIUM CHLORIDE 0.9 % IV BOLUS
1000.0000 mL | Freq: Once | INTRAVENOUS | Status: AC
  Administered 2024-06-06: 1000 mL via INTRAVENOUS

## 2024-06-06 NOTE — ED Notes (Signed)
 Pt unable to urinate at this time.

## 2024-06-06 NOTE — ED Notes (Signed)
 Pt states she is right hand dominate and works for [employer], her job involves heavy lifting.

## 2024-06-06 NOTE — ED Provider Notes (Signed)
 [place] EMERGENCY DEPARTMENT AT [facility] Provider Note   CSN: [id number] Arrival date & time: 06/06/24  1600     Patient presents with: Chest Pain   Shelley Love is a 38 y.o. female.   38 year old female presents for evaluation of chest pain.  She states it has been off and on for a few days but today at work started on the right side radiated to the left and back to the right.  She states it improved when she got to the ER.  She states she has been working more often lately and she works in [employer], [employer] and goes from inside outside [employer], [employer].  States she also feels like she been having panic attacks more frequently as well.  Is not on anything for anxiety at home.  Admits to some shortness of breath and lightheadedness when she gets a sensation in her chest.  Describes it as a sharp.  Denies any other symptoms or concerns at this time.   Chest Pain Associated symptoms: shortness of breath   Associated symptoms: no abdominal pain, no back pain, no cough, no fever, no palpitations and no vomiting        Prior to Admission medications   Medication Sig Start Date End Date Taking? Authorizing Provider  hydrOXYzine  (ATARAX ) 25 MG tablet Take 1 tablet (25 mg total) by mouth every 8 (eight) hours as needed for up to 7 days for anxiety. 06/06/24 06/13/24 Yes Cailah Reach L, DO  cetirizine  (ZYRTEC  ALLERGY) 10 MG tablet Take 1 tablet (10 mg total) by mouth daily. 11/21/23   Christopher Savannah, PA-C  ibuprofen  (ADVIL ) 600 MG tablet Take 1 tablet (600 mg total) by mouth every 6 (six) hours as needed. 11/21/23   Christopher Savannah, PA-C  promethazine -dextromethorphan (PROMETHAZINE -DM) 6.25-15 MG/5ML syrup Take 5 mLs by mouth 3 (three) times daily as needed for cough. 11/21/23   Christopher Savannah, PA-C  pseudoephedrine  (SUDAFED) 60 MG tablet Take 1 tablet (60 mg total) by mouth every 8 (eight) hours as needed for congestion. 11/21/23   Christopher Savannah, PA-C    Allergies: Patient has no known  allergies.    Review of Systems  Constitutional:  Negative for chills and fever.  HENT:  Negative for ear pain and sore throat.   Eyes:  Negative for pain and visual disturbance.  Respiratory:  Positive for shortness of breath. Negative for cough.   Cardiovascular:  Positive for chest pain. Negative for palpitations.  Gastrointestinal:  Negative for abdominal pain and vomiting.  Genitourinary:  Negative for dysuria and hematuria.  Musculoskeletal:  Negative for arthralgias and back pain.  Skin:  Negative for color change and rash.  Neurological:  Negative for seizures and syncope.  All other systems reviewed and are negative.   Updated Vital Signs BP 120/84 (BP Location: Right Arm)   Pulse 70   Temp 98.1 F (36.7 C) (Oral)   Resp 16   Wt 70.8 kg   LMP 05/12/2024 (Approximate)   SpO2 100%   BMI 25.96 kg/m   Physical Exam Vitals and nursing note reviewed.  Constitutional:      General: She is not in acute distress.    Appearance: She is well-developed. She is not ill-appearing.  HENT:     Head: Normocephalic and atraumatic.  Eyes:     Conjunctiva/sclera: Conjunctivae normal.  Cardiovascular:     Rate and Rhythm: Normal rate and regular rhythm.     Heart sounds: Normal heart sounds. No murmur heard. Pulmonary:  Effort: Pulmonary effort is normal. No respiratory distress.     Breath sounds: Normal breath sounds. No decreased breath sounds or wheezing.  Abdominal:     Palpations: Abdomen is soft.     Tenderness: There is no abdominal tenderness.  Musculoskeletal:        General: No swelling.     Cervical back: Neck supple.  Skin:    General: Skin is warm and dry.     Capillary Refill: Capillary refill takes less than 2 seconds.  Neurological:     Mental Status: She is alert.  Psychiatric:        Mood and Affect: Mood normal.     (all labs ordered are listed, but only abnormal results are displayed) Labs Reviewed  CBC WITH DIFFERENTIAL/PLATELET  BASIC  METABOLIC PANEL WITH GFR  PREGNANCY, URINE  URINALYSIS, ROUTINE W REFLEX MICROSCOPIC  TROPONIN I (HIGH SENSITIVITY)    EKG: EKG Interpretation Date/Time:  Wednesday June 06 2024 17:39:18 EDT Ventricular Rate:  55 PR Interval:  119 QRS Duration:  91 QT Interval:  436 QTC Calculation: 417 R Axis:   68  Text Interpretation: Sinus rhythm Borderline short PR interval RSR' in V1 or V2, probably normal variant Confirmed by Ula Barter [phone]) on 06/06/2024 6:05:49 PM  Radiology: DG Chest 1 View Result Date: 06/06/2024 CLINICAL DATA:  Right-sided chest pain. EXAM: CHEST  1 VIEW COMPARISON:  04/27/2021. FINDINGS: Trachea is midline. Heart size is accentuated by AP technique. Asymmetric slight added density in the right suprahilar region, possibly present on 04/27/2021. Lungs are otherwise clear. No pleural fluid. IMPRESSION: Asymmetric slight added density in the right suprahilar region, possibly present on 04/27/2021. That image was slightly rotated, making comparison challenging. If further evaluation is desired, CT chest with contrast could be performed. Electronically Signed   By: Newell Eke M.D.   On: 06/06/2024 17:32     Procedures   Medications Ordered in the ED  sodium chloride  0.9 % bolus 1,000 mL (0 mLs Intravenous Stopped 06/06/24 1917)                                    Medical Decision Making Medical Decision Making Nursing notes are reviewed. Differential diagnosis for this patient would include but not limited to: Anxiety, GERD, musculoskeletal chest pain, dehydration, pneumothorax, other  Records reviewed: Outpatient records reviewed - patient last seen at urgent care in 11/2023 for acute viral uri    Studies: Chest x-ray reviewed and interpreted independent of radiology and shows no evidence of acute cardiopulmonary disease  EKG interpretation: EKG interpreted by me in the absence of cardiology and shows sinus rhythm, normal intervals, normal axis, no STEMI, no  previous EKG for comparison   Cardiac monitor interpretation: sinus rhythm, no ectopy   Emergency Department Course:  Vital signs and pulse oximetry are reviewed, evaluated by myself and found to be within normal limits prior to final disposition. Findings of laboratory testing and medical imaging are discussed with patient and family that is available. Patient agrees with the medical care plan as follows:  Patient's lab workup reviewed by me and unremarkable.  EKG within normal limits. Imaging as above. She had stable vital signs thorughout her stay in the ER. She was given fluids and feeling much better.  Sounds like she is having panic attacks but there is no acute cardiac etiology for her symptoms at this time.  Will start her on  Vistaril  to use as needed.  Advise close up with primary care doctor.  Vies Tylenol  Motrin  as needed for pain and to increase her fluid intake.  Advise return to the ER for any worsening symptoms.  She feels comfortable plan be discharged home.  Problems Addressed: Atypical chest pain: acute illness or injury Panic attacks: acute illness or injury  Amount and/or Complexity of Data Reviewed External Data Reviewed: notes. Labs: ordered. Decision-making details documented in ED Course. Radiology: ordered and independent interpretation performed. Decision-making details documented in ED Course. ECG/medicine tests: ordered and independent interpretation performed. Decision-making details documented in ED Course.  Risk OTC drugs. Prescription drug management. Drug therapy requiring intensive monitoring for toxicity.     Final diagnoses:  Atypical chest pain  Panic attacks    ED Discharge Orders          Ordered    hydrOXYzine  (ATARAX ) 25 MG tablet  Every 8 hours PRN        06/06/24 1919               Melenie Minniear L, DO 06/06/24 1950

## 2024-06-06 NOTE — Discharge Instructions (Signed)
 You can take your Vistaril  as needed for panic attacks.  You can use Tylenol  and Motrin  as needed for chest pain.  Follow-up with your primary care doctor as needed.

## 2024-06-06 NOTE — ED Triage Notes (Signed)
 POV/ by self/ right side chest pain/ x2 weeks/ worse today/ pt admits to having anxiety/ pt states she feels a sense of dread and overwhelm pt also reports feeling altered/ pt works out in the heat/ pt appears A&Ox4/
